# Patient Record
Sex: Male | Born: 1980 | Race: White | Hispanic: No | Marital: Married | Smoking: Former smoker
Health system: Southern US, Community
[De-identification: ages and names within clinical notes are randomized; demographics above are authoritative.]

## PROBLEM LIST (undated history)

## (undated) DIAGNOSIS — F1110 Opioid abuse, uncomplicated: Secondary | ICD-10-CM

## (undated) DIAGNOSIS — W3400XA Accidental discharge from unspecified firearms or gun, initial encounter: Secondary | ICD-10-CM

## (undated) DIAGNOSIS — W540XXA Bitten by dog, initial encounter: Secondary | ICD-10-CM

## (undated) DIAGNOSIS — G894 Chronic pain syndrome: Secondary | ICD-10-CM

## (undated) DIAGNOSIS — Y249XXA Unspecified firearm discharge, undetermined intent, initial encounter: Secondary | ICD-10-CM

## (undated) DIAGNOSIS — F1990 Other psychoactive substance use, unspecified, uncomplicated: Secondary | ICD-10-CM

## (undated) DIAGNOSIS — F32A Depression, unspecified: Secondary | ICD-10-CM

## (undated) DIAGNOSIS — F329 Major depressive disorder, single episode, unspecified: Secondary | ICD-10-CM

## (undated) DIAGNOSIS — F1410 Cocaine abuse, uncomplicated: Secondary | ICD-10-CM

## (undated) HISTORY — PX: OTHER SURGICAL HISTORY: SHX169

## (undated) HISTORY — PX: APPENDECTOMY: SHX54

---

## 2003-11-01 ENCOUNTER — Inpatient Hospital Stay (HOSPITAL_COMMUNITY): Admission: EM | Admit: 2003-11-01 | Discharge: 2003-11-02 | Payer: Self-pay

## 2004-03-01 ENCOUNTER — Emergency Department (HOSPITAL_COMMUNITY): Admission: EM | Admit: 2004-03-01 | Discharge: 2004-03-02 | Payer: Self-pay | Admitting: Emergency Medicine

## 2004-03-08 ENCOUNTER — Emergency Department (HOSPITAL_COMMUNITY): Admission: EM | Admit: 2004-03-08 | Discharge: 2004-03-08 | Payer: Self-pay | Admitting: Emergency Medicine

## 2011-04-27 ENCOUNTER — Emergency Department (HOSPITAL_COMMUNITY)
Admission: EM | Admit: 2011-04-27 | Discharge: 2011-04-28 | Disposition: A | Discharge status: Other Acute Inpatient Hospital | Payer: Self-pay | Attending: Emergency Medicine | Admitting: Emergency Medicine

## 2011-04-27 DIAGNOSIS — R45851 Suicidal ideations: Secondary | ICD-10-CM | POA: Insufficient documentation

## 2011-04-27 DIAGNOSIS — IMO0002 Reserved for concepts with insufficient information to code with codable children: Secondary | ICD-10-CM | POA: Insufficient documentation

## 2011-04-27 DIAGNOSIS — F1910 Other psychoactive substance abuse, uncomplicated: Secondary | ICD-10-CM | POA: Insufficient documentation

## 2011-04-27 LAB — COMPREHENSIVE METABOLIC PANEL
ALT: 16 U/L (ref 0–53)
AST: 22 U/L (ref 0–37)
Albumin: 4.3 g/dL (ref 3.5–5.2)
Alkaline Phosphatase: 55 U/L (ref 39–117)
BUN: 10 mg/dL (ref 6–23)
CO2: 26 mEq/L (ref 19–32)
Calcium: 9.8 mg/dL (ref 8.4–10.5)
Chloride: 106 mEq/L (ref 96–112)
Creatinine, Ser: 1.02 mg/dL (ref 0.4–1.5)
GFR calc Af Amer: >60 mL/min (ref >60)
GFR calc non Af Amer: >60 mL/min (ref >60)
Glucose, Bld: 107 mg/dL — ABNORMAL HIGH (ref 70–99)
Potassium: 3.9 mEq/L (ref 3.5–5.1)
Sodium: 142 mEq/L (ref 135–145)
Total Bilirubin: 0.2 mg/dL — ABNORMAL LOW (ref 0.3–1.2)
Total Protein: 7.3 g/dL (ref 6.0–8.3)

## 2011-04-27 LAB — CBC
HCT: 44.2 % (ref 39.0–52.0)
Hemoglobin: 15.1 g/dL (ref 13.0–17.0)
MCH: 32.4 pg (ref 26.0–34.0)
MCHC: 34.2 g/dL (ref 30.0–36.0)
MCV: 94.8 fL (ref 78.0–100.0)
Platelets: 327 10*3/uL (ref 150–400)
RBC: 4.66 MIL/uL (ref 4.22–5.81)
RDW: 13.9 % (ref 11.5–15.5)
WBC: 5.1 10*3/uL (ref 4.0–10.5)

## 2011-04-27 LAB — RAPID URINE DRUG SCREEN, HOSP PERFORMED
Amphetamines: NOT DETECTED
Barbiturates: NOT DETECTED
Benzodiazepines: NOT DETECTED
Cocaine: POSITIVE — AB
Opiates: NOT DETECTED
Tetrahydrocannabinol: POSITIVE — AB

## 2011-04-27 LAB — DIFFERENTIAL
Basophils Absolute: 0.1 10*3/uL (ref 0.0–0.1)
Basophils Relative: 2 % — ABNORMAL HIGH (ref 0–1)
Eosinophils Absolute: 0.1 10*3/uL (ref 0.0–0.7)
Eosinophils Relative: 2 % (ref 0–5)
Lymphocytes Relative: 29 % (ref 12–46)
Lymphs Abs: 1.5 10*3/uL (ref 0.7–4.0)
Monocytes Absolute: 0.5 10*3/uL (ref 0.1–1.0)
Monocytes Relative: 11 % (ref 3–12)
Neutro Abs: 2.9 10*3/uL (ref 1.7–7.7)
Neutrophils Relative %: 57 % (ref 43–77)

## 2011-04-27 LAB — ETHANOL: Alcohol, Ethyl (B): <11 mg/dL — ABNORMAL HIGH (ref 0–10)

## 2011-04-28 ENCOUNTER — Inpatient Hospital Stay (HOSPITAL_COMMUNITY)
Admission: AD | Admit: 2011-04-28 | Discharge: 2011-04-30 | DRG: 897 | Disposition: A | Discharge status: Home / Self Care | Payer: PRIVATE HEALTH INSURANCE | Source: Ambulatory Visit | Attending: Psychiatry | Admitting: Psychiatry

## 2011-04-28 DIAGNOSIS — R45851 Suicidal ideations: Secondary | ICD-10-CM

## 2011-04-28 DIAGNOSIS — F1020 Alcohol dependence, uncomplicated: Principal | ICD-10-CM

## 2011-04-28 DIAGNOSIS — F1420 Cocaine dependence, uncomplicated: Secondary | ICD-10-CM

## 2011-04-28 DIAGNOSIS — F4310 Post-traumatic stress disorder, unspecified: Secondary | ICD-10-CM

## 2011-04-29 DIAGNOSIS — F1920 Other psychoactive substance dependence, uncomplicated: Secondary | ICD-10-CM

## 2011-04-29 LAB — TSH: TSH: 1.779 u[IU]/mL (ref 0.350–4.500)

## 2011-05-06 NOTE — H&P (Signed)
  NAME:  Jimmy Rogers, Jimmy Rogers NO.:  [id number]  MEDICAL RECORD NO.:  [id number]  LOCATION:  0301                          FACILITY:  [facility]  PHYSICIAN:  Franchot Gallo, MD     DATE OF BIRTH:  [birth year]  DATE OF ADMISSION:  04/28/2011 DATE OF DISCHARGE:  04/30/2011                      PSYCHIATRIC ADMISSION ASSESSMENT   IDENTIFYING INFORMATION:  This is a 30 year old Caucasian male.  This is a voluntary admission.  HISTORY OF PRESENT ILLNESS:  This is first BAC admission for Jimmy Rogers, who presented by way of our emergency room requesting detox from alcohol and crack cocaine.  He had been drinking alcohol for a couple of days and describes himself as a ticking time bomb.  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 He is requesting help getting sober and establishing a relapse prevention program.  MEDICAL EVALUATION AND DIAGNOSTIC STUDIES:  Medically evaluated in the [facility] emergency room.  He presented as an afebrile normally- developed well-nourished male with normal vital signs.  No acute signs of withdrawal.  No history of blackouts, seizures or DTs and no chronic medical illnesses.  CBC was normal.  Alcohol screen negative.  Urine drug screen positive for marijuana and cocaine and basic chemistry and electrolytes normal.  COURSE OF HOSPITALIZATION:  He is admitted to our Detox Unit and gradually assimilated into the milieu participation with group therapy and peers and staff was appropriate while here.  He was able to verbalize that he had broken up with his girlfriend because of his drug use and he was hoping to get her back and live a sober life.  He felt he had gotten involved with the wrong social group and was wanting to change his contacts and his lifestyle.  He did not display any acute withdrawal symptoms while here.  We made Librium 25 mg q.6 h p.r.n. for alcohol withdrawal available while he was  here and he did not require a full detox protocol.  Consistently and convincingly denied any suicidal thoughts and was ready for discharge by April 30, 2011.  DISCHARGE DIAGNOSES:  AXIS I:  Polysubstance dependence. AXIS II:  No diagnosis. AXIS III:  No diagnosis. AXIS IV:  Chronic social stressors. AXIS V:  Current 60, past year not known.  DISCHARGE CONDITION:  Stable.  DISCHARGE MEDICATIONS:  Hydroxyzine 25 mg b.i.d. for anxiety and 50 mg as needed at night for insomnia.  FOLLOWUP:  Plan is to complete 90 meetings in 90 days at [facility] and he is scheduled for follow-up counseling at his request at [facility] mental health services on May 03, 2011 at 9:00 a.m.     Margaret A. Lorin Picket, N.P.   ______________________________ Franchot Gallo, MD    MAS/MEDQ  D:  05/02/2011  T:  05/02/2011  Job:  [id number]  Electronically Signed by Kari Baars N.P. on 05/06/2011 05:02:17 PM Electronically Signed by Franchot Gallo MD on 05/06/2011 06:12:11 PM

## 2011-05-17 NOTE — H&P (Signed)
NAME:  Jimmy Rogers, Jimmy Rogers NO.:  [id number]  MEDICAL RECORD NO.:  [id number]           PATIENT TYPE:  I  LOCATION:  0306                          FACILITY:  [facility]  PHYSICIAN:  Eulogio Ditch, MD DATE OF BIRTH:  [birth year]  DATE OF ADMISSION:  04/28/2011 DATE OF DISCHARGE:                      PSYCHIATRIC ADMISSION ASSESSMENT   HPI:  This is a voluntary admission of a 30 year old single white male. He presented at [facility] requesting detox from alcohol, cocaine, and crack.  He reported that his last drink and drug use were earlier in the morning.  He reports nausea and back pain due to not eating right.  He states:  "I've been on the edge lately, and if I don't get my health right, I don't know what I can do."  He reports telling his mother that he wants to kill himself.  He reports being on Xanax in the past, and that has helped him, but recently not being able to get it filled due to insurance issues.  Interestingly enough, his urinalysis was positive for cocaine and marijuana.  He had no measurable alcohol. During the interview, he amended this story to he knew he needed to come in to get off of his stress and get away from the cocaine.  He says he has only been drinking for a couple of days.  He has never had any history of DTs, blackouts, or seizures; and in terms of the Xanax, he had a therapist at one point who recommended Xanax, but as he was uninsured, he could never go get a prescription.  His mother apparently has given him some of her benzos on occasion.  The mother has been trying to help him find inpatient detox resources.  Per the mother, he stated he was going to kill himself if he did not get the help he needed.  The patient states he is a ticking time bomb.  PAST PSYCHIATRIC HISTORY:  He says at age 46, he saw a psychologist for a few visits, so that was 3 years ago.  SOCIAL HISTORY:  He is a high Garment/textile technologist in 2003.  He has  never married.  He has no children.  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  FAMILY HISTORY:  He denies an alcohol and drug history.  He states he drinks here and there.  He needed to be admitted for a break from cocaine, according to the ACT team.  He states that he has been using about $1,000 worth daily for the past 2 to 3 months, and his first use was at age 30, almost 10 years ago.  PRIMARY CARE PROVIDER:  He does not have any.  MEDICAL PROBLEMS:  He states his blood pressure elevates when stressed.  MEDICATIONS:  None.  DRUG ALLERGIES:  NO KNOWN DRUG ALLERGIES.  POSITIVE PHYSICAL FINDINGS:  He was medically cleared in the ED at [facility].  His vital signs showed he was afebrile.  His temperature ranged from 97.4 up to 98.5.  His pulse was 64 to 93.  Respirations were 16 to  20, and his blood pressure was 112/74 to a marked elevation of 128/82.  He appears to be his stated age.  He is in no acute distress, and he has no history of blackouts, seizures, or DTs.  His CBC had no abnormalities.  His metabolic profile showed a slightly elevated glucose at 107.  He had no measurable alcohol, just marijuana and cocaine in his urine.  MENTAL STATUS EXAM:  Tonight, he is alert and oriented.  He is appropriately groomed, dressed, and nourished.  He is in no acute distress.  His speech is not pressured.  His mood is appropriate to the situation.  Affect has a normal range.  Thought processes are clear, rational, goal-oriented.  He is wanting to know when he will next get medicine, as the Ativan 1 mg that he received over in the Emergency Room was just not enough to deal with his stress.  He denies being suicidal or homicidal.  He smiles and says:  "I had to say that to get in.  I needed to get away for a few days."  He has no auditory or visual hallucinations. AXIS I:  Polysubstance dependence. AXIS II:  Deferred. AXIS III:  None  known. AXIS IV:  Moderate (living environment). AXIS V:  55.  The plan is to admit for safety and stabilization.  We will give him Librium 25 mg p.o. q.6h. p.r.n. alcohol withdrawal.  He can have Vistaril 50 mg at h.s.; may repeat x1.  He can discuss tomorrow further medication.  The estimated length of stay is 2 to 3 days.     Mickie Leonarda Salon, P.A.-C.   ______________________________ Eulogio Ditch, MD    MD/MEDQ  D:  04/28/2011  T:  04/28/2011  Job:  [id number]  Electronically Signed by Jaci Lazier ADAMS P.A.-C. on 05/08/2011 09:46:38 AM Electronically Signed by Eulogio Ditch  on 05/17/2011 09:54:48 AM

## 2011-06-27 ENCOUNTER — Emergency Department (HOSPITAL_COMMUNITY)
Admission: EM | Admit: 2011-06-27 | Discharge: 2011-06-27 | Disposition: A | Discharge status: Home / Self Care | Payer: PRIVATE HEALTH INSURANCE | Attending: Emergency Medicine | Admitting: Emergency Medicine

## 2011-06-27 ENCOUNTER — Emergency Department (HOSPITAL_COMMUNITY): Payer: PRIVATE HEALTH INSURANCE

## 2011-06-27 DIAGNOSIS — R1012 Left upper quadrant pain: Secondary | ICD-10-CM | POA: Insufficient documentation

## 2011-06-27 DIAGNOSIS — M549 Dorsalgia, unspecified: Secondary | ICD-10-CM | POA: Insufficient documentation

## 2011-06-27 DIAGNOSIS — Y990 Civilian activity done for income or pay: Secondary | ICD-10-CM | POA: Insufficient documentation

## 2011-06-27 DIAGNOSIS — R079 Chest pain, unspecified: Secondary | ICD-10-CM | POA: Insufficient documentation

## 2011-06-27 DIAGNOSIS — S301XXA Contusion of abdominal wall, initial encounter: Secondary | ICD-10-CM | POA: Insufficient documentation

## 2011-06-27 DIAGNOSIS — IMO0002 Reserved for concepts with insufficient information to code with codable children: Secondary | ICD-10-CM | POA: Insufficient documentation

## 2011-06-27 DIAGNOSIS — Y9269 Other specified industrial and construction area as the place of occurrence of the external cause: Secondary | ICD-10-CM | POA: Insufficient documentation

## 2011-06-27 MED ORDER — IOHEXOL 300 MG/ML  SOLN
100.0000 mL | Freq: Once | INTRAMUSCULAR | Status: AC | PRN
  Administered 2011-06-27: 100 mL via INTRAVENOUS

## 2011-07-25 ENCOUNTER — Emergency Department (HOSPITAL_COMMUNITY)
Admission: EM | Admit: 2011-07-25 | Discharge: 2011-07-25 | Disposition: A | Discharge status: Home / Self Care | Payer: PRIVATE HEALTH INSURANCE | Attending: Emergency Medicine | Admitting: Emergency Medicine

## 2011-07-25 DIAGNOSIS — Z042 Encounter for examination and observation following work accident: Secondary | ICD-10-CM | POA: Insufficient documentation

## 2012-01-10 ENCOUNTER — Emergency Department (HOSPITAL_BASED_OUTPATIENT_CLINIC_OR_DEPARTMENT_OTHER)
Admission: EM | Admit: 2012-01-10 | Discharge: 2012-01-10 | Disposition: A | Discharge status: Home / Self Care | Payer: Self-pay | Attending: Emergency Medicine | Admitting: Emergency Medicine

## 2012-01-10 ENCOUNTER — Encounter (HOSPITAL_BASED_OUTPATIENT_CLINIC_OR_DEPARTMENT_OTHER): Payer: Self-pay | Admitting: *Deleted

## 2012-01-10 DIAGNOSIS — W540XXA Bitten by dog, initial encounter: Secondary | ICD-10-CM | POA: Insufficient documentation

## 2012-01-10 DIAGNOSIS — Y92410 Unspecified street and highway as the place of occurrence of the external cause: Secondary | ICD-10-CM | POA: Insufficient documentation

## 2012-01-10 DIAGNOSIS — S01512A Laceration without foreign body of oral cavity, initial encounter: Secondary | ICD-10-CM

## 2012-01-10 DIAGNOSIS — S01501A Unspecified open wound of lip, initial encounter: Secondary | ICD-10-CM | POA: Insufficient documentation

## 2012-01-10 MED ORDER — OXYCODONE-ACETAMINOPHEN 5-325 MG PO TABS: 2.0000 | ORAL_TABLET | ORAL | Status: AC | PRN

## 2012-01-10 MED ORDER — AMOXICILLIN-POT CLAVULANATE 875-125 MG PO TABS: 1.0000 | ORAL_TABLET | Freq: Two times a day (BID) | ORAL | Status: AC

## 2012-01-10 MED ORDER — AMOXICILLIN-POT CLAVULANATE 875-125 MG PO TABS
1.0000 | ORAL_TABLET | Freq: Once | ORAL | Status: AC
  Administered 2012-01-10: 1 via ORAL
  Filled 2012-01-10: qty 1

## 2012-01-10 MED ORDER — TETANUS-DIPHTH-ACELL PERTUSSIS 5-2.5-18.5 LF-MCG/0.5 IM SUSP
0.5000 mL | Freq: Once | INTRAMUSCULAR | Status: AC
  Administered 2012-01-10: 0.5 mL via INTRAMUSCULAR
  Filled 2012-01-10: qty 0.5

## 2012-01-10 MED ORDER — LIDOCAINE-EPINEPHRINE-TETRACAINE (LET) SOLUTION
3.0000 mL | Freq: Once | NASAL | Status: AC
  Administered 2012-01-10: 3 mL via TOPICAL

## 2012-01-10 MED ORDER — LIDOCAINE-EPINEPHRINE-TETRACAINE (LET) SOLUTION
NASAL | Status: AC
  Administered 2012-01-10: 3 mL via TOPICAL
  Filled 2012-01-10: qty 3

## 2012-01-10 MED ORDER — LIDOCAINE HCL 2 % IJ SOLN
10.0000 mL | Freq: Once | INTRAMUSCULAR | Status: AC
  Administered 2012-01-10: 20 mg via INTRADERMAL
  Filled 2012-01-10: qty 1

## 2012-01-10 MED ORDER — OXYCODONE-ACETAMINOPHEN 5-325 MG PO TABS
2.0000 | ORAL_TABLET | Freq: Once | ORAL | Status: AC
  Administered 2012-01-10: 2 via ORAL
  Filled 2012-01-10: qty 2

## 2012-01-10 NOTE — Discharge Instructions (Signed)
Animal Bite An animal bite can result in a scratch on the skin, deep open cut, puncture of the skin, crush injury, or tearing away of the skin or a body part. Dogs are responsible for most animal bites. Children are bitten more often than adults. An animal bite can range from very mild to more serious. A small bite from your house pet is no cause for alarm. However, some animal bites can become infected or injure a bone or other tissue. You must seek medical care if:  The skin is broken and bleeding does not slow down or stop after 15 minutes.   The puncture is deep and difficult to clean (such as a cat bite).   Pain, warmth, redness, or pus develops around the wound.   The bite is from a stray animal or rodent. There may be a risk of rabies infection.   The bite is from a snake, raccoon, skunk, fox, coyote, or bat. There may be a risk of rabies infection.   The person bitten has a chronic illness such as diabetes, liver disease, or cancer, or the person takes medicine that lowers the immune system.   There is concern about the location and severity of the bite.  It is important to clean and protect an animal bite wound right away to prevent infection. Follow these steps:  Clean the wound with plenty of water and soap.   Apply an antibiotic cream.   Apply gentle pressure over the wound with a clean towel or gauze to slow or stop bleeding.   Elevate the affected area above the heart to help stop any bleeding.   Seek medical care. Getting medical care within 8 hours of the animal bite leads to the best possible outcome.  DIAGNOSIS  Your caregiver will most likely:  Take a detailed history of the animal and the bite injury.   Perform a wound exam.   Take your medical history.  Blood tests or X-rays may be performed. Sometimes, infected bite wounds are cultured and sent to a lab to identify the infectious bacteria.  TREATMENT  Medical treatment will depend on the location and type of  animal bite as well as the patient's medical history. Treatment may include:  Wound care, such as cleaning and flushing the wound with saline solution, bandaging, and elevating the affected area.   Antibiotics.   Tetanus immunization.   Rabies immunization.   Leaving the wound open to heal. This is often done with animal bites, due to the high risk of infection. However, in certain cases, wound closure with stitches, wound adhesive, skin adhesive strips, or staples may be used.  Infected bites that are left untreated may require intravenous (IV) antibiotics and surgical treatment in the hospital. HOME CARE INSTRUCTIONS  Follow your caregiver's instructions for wound care.   Take all medicines as directed.   If your caregiver prescribes antibiotics, take them as directed. Finish them even if you start to feel better.   Follow up with your caregiver for further exams or immunizations as directed.  You may need a tetanus shot if:  You cannot remember when you had your last tetanus shot.   You have never had a tetanus shot.   The injury broke your skin.  If you get a tetanus shot, your arm may swell, get red, and feel warm to the touch. This is common and not a problem. If you need a tetanus shot and you choose not to have one, there is a   rare chance of getting tetanus. Sickness from tetanus can be serious. SEEK MEDICAL CARE IF:  You notice warmth, redness, soreness, swelling, pus discharge, or a bad smell coming from the wound.   You have a red line on the skin coming from the wound.   You have a fever, chills, or a general ill feeling.   You have nausea or vomiting.   You have continued or worsening pain.   You have trouble moving the injured part.   You have other questions or concerns.  MAKE SURE YOU:  Understand these instructions.   Will watch your condition.   Will get help right away if you are not doing well or get worse.  Document Released: 08/02/2011  Document Reviewed: 07/06/2011 [employer] Patient Information 2012 [employer]. 

## 2012-01-10 NOTE — ED Notes (Signed)
Walking his dog  3 other dogs jumped on his dog he picked his dog up and his dog bit him in the mouth happened one hour ago  States his dog is vaccinated.Bleeding from lip right upper lip large laceration, puncture and laceration on upper left lip also.

## 2012-01-10 NOTE — ED Notes (Signed)
PA Sophia at bedside suturing pt.

## 2012-01-10 NOTE — ED Provider Notes (Signed)
Medical screening examination/treatment/procedure(s) were conducted as a shared visit with non-physician practitioner(s) and myself.  I personally evaluated the patient during the encounter  Pt seen and evaluated by me as well- multiple complicated lacerations to lip, closed by PA.  Much improved appearance after closure with swelling of right upper lip  Jimmy Chick, MD 01/10/12 [phone]

## 2012-01-10 NOTE — ED Provider Notes (Signed)
History     CSN: [id number]  Arrival date & time 01/10/12  1658   First MD Initiated Contact with Patient 01/10/12 1704      Chief Complaint  Patient presents with  . Animal Bite    (Consider location/radiation/quality/duration/timing/severity/associated sxs/prior treatment) Patient is a 31 y.o. male presenting with animal bite. The history is provided by the patient. No language interpreter was used.  Animal Bite  The incident occurred just prior to arrival. There is an injury to the head and mouth. The pain is moderate. It is unlikely that a foreign body is present. Pertinent negatives include no tingling. There have been no prior injuries to these areas. His tetanus status is out of date. He has been behaving normally. There were no sick contacts. He has received no recent medical care.   patient reports he was walking his dog when 3 other dogs ran at him and tried to attack his dog. Patient reports he picked up his dog to protect him and his dog bit him in the upper lip. Patient reports his dog shots are up-to-date. Patient reports he was not bitten by the other dogs. Patient denies any other area of injury other than upper lip. Patient denies any medical problems. patient thinks upper teeth may be loose.  History reviewed. No pertinent past medical history.  History reviewed. No pertinent past surgical history.  History reviewed. No pertinent family history.  History  Substance Use Topics  . Smoking status: Current Everyday Smoker  . Smokeless tobacco: Not on file  . Alcohol Use: Yes      Review of Systems  Skin: Positive for wound.  Neurological: Negative for tingling.  All other systems reviewed and are negative.    Allergies  Vicodin  Home Medications   Current Outpatient Rx  Name Route Sig Dispense Refill  . AMOXICILLIN-POT CLAVULANATE 875-125 MG PO TABS Oral Take 1 tablet by mouth 2 (two) times daily. 10 tablet 0  . OXYCODONE-ACETAMINOPHEN 5-325 MG PO TABS  Oral Take 2 tablets by mouth every 4 (four) hours as needed for pain. 16 tablet 0    BP 147/84  Pulse 76  Temp(Src) 98.5 F (36.9 C) (Oral)  Resp 20  Wt 165 lb (74.844 kg)  SpO2 100%  Physical Exam  Nursing note and vitals reviewed. Constitutional: He is oriented to person, place, and time. He appears well-developed and well-nourished.  HENT:  Head: Normocephalic and atraumatic.  Right Ear: External ear normal.  Left Ear: External ear normal.  Nose: Nose normal.  Mouth/Throat: Oropharynx is clear and moist.       Laceration mid upper lip, multiple tear areas through mid vermilion border. Approximately 1.5 cm mid lip. 2 cm gaping laceration right upper outer lip  Eyes: Conjunctivae and EOM are normal. Pupils are equal, round, and reactive to light.  Neck: Normal range of motion.  Cardiovascular: Normal rate.   Pulmonary/Chest: Effort normal.  Musculoskeletal: Normal range of motion.  Neurological: He is alert and oriented to person, place, and time. He has normal reflexes. No cranial nerve deficit.  Skin: Skin is warm.  Psychiatric: He has a normal mood and affect.    ED Course  LACERATION REPAIR Date/Time: 01/10/2012 6:35 PM Performed by: Langston Masker Authorized by: Langston Masker Consent: Verbal consent obtained. Written consent not obtained. Risks and benefits: risks, benefits and alternatives were discussed Consent given by: patient Patient understanding: patient states understanding of the procedure being performed Patient identity confirmed: verbally with patient and arm  band Time out: Immediately prior to procedure a "time out" was called to verify the correct patient, procedure, equipment, support staff and site/side marked as required. Body area: mouth Laceration length: 1.5 cm Foreign bodies: no foreign bodies Tendon involvement: none Anesthesia: local infiltration Patient sedated: no Preparation: Patient was prepped and draped in the usual sterile  fashion. Irrigation solution: saline Amount of cleaning: extensive Debridement: minimal Degree of undermining: none Skin closure: 6-0 Prolene Subcutaneous closure: 5-0 Chromic gut Number of sutures: 7 Technique: simple Approximation: loose Approximation difficulty: complex Patient tolerance: Patient tolerated the procedure well with no immediate complications.  LACERATION REPAIR Date/Time: 01/10/2012 6:39 PM Performed by: Langston Masker Authorized by: Langston Masker Consent: Verbal consent obtained. Risks and benefits: risks, benefits and alternatives were discussed Consent given by: patient Patient understanding: patient states understanding of the procedure being performed Patient identity confirmed: verbally with patient and arm band Time out: Immediately prior to procedure a "time out" was called to verify the correct patient, procedure, equipment, support staff and site/side marked as required. Body area: mouth Laceration length: 2 cm Contamination: The wound is contaminated. Foreign bodies: no foreign bodies Vascular damage: no Anesthesia: local infiltration Local anesthetic: lidocaine 2% without epinephrine Patient sedated: no Preparation: Patient was prepped and draped in the usual sterile fashion. Irrigation solution: saline Amount of cleaning: extensive Debridement: minimal Skin closure: 6-0 Prolene Subcutaneous closure: 5-0 Vicryl Number of sutures: 10 Technique: complex Approximation: close Approximation difficulty: complex Patient tolerance: Patient tolerated the procedure well with no immediate complications. Comments: Complicated closure with 3 sutures to the deep tissue to approximate area. Debridement of disrupted dark tissue. Wounds approximated well.   (including critical care time)  Labs Reviewed - No data to display No results found.   No diagnosis found.    MDM   patient given 2 Percocets, Augmentin 875 his tetanus is updated, let is applied to  lacerations. Patient counseled that this wound is high risk for infection. I counseled patient on dog bites patient is to return here in 2 days to recheck with me. He is to return here on Sunday at 4 PM to see me for suture removal. Patient advised he may need to see a plastic surgeon for cosmetic revision at a later date. Patient agrees to return to the emergency department if any signs of infection or any problem before suggested recheck.        Langston Masker, [place] 01/10/12 2243

## 2012-01-12 ENCOUNTER — Encounter (HOSPITAL_BASED_OUTPATIENT_CLINIC_OR_DEPARTMENT_OTHER): Payer: Self-pay | Admitting: *Deleted

## 2012-01-12 ENCOUNTER — Emergency Department (HOSPITAL_BASED_OUTPATIENT_CLINIC_OR_DEPARTMENT_OTHER)
Admission: EM | Admit: 2012-01-12 | Discharge: 2012-01-12 | Disposition: A | Discharge status: Home / Self Care | Payer: Self-pay | Attending: Emergency Medicine | Admitting: Emergency Medicine

## 2012-01-12 DIAGNOSIS — S0181XA Laceration without foreign body of other part of head, initial encounter: Secondary | ICD-10-CM

## 2012-01-12 DIAGNOSIS — Z09 Encounter for follow-up examination after completed treatment for conditions other than malignant neoplasm: Secondary | ICD-10-CM | POA: Insufficient documentation

## 2012-01-12 MED ORDER — OXYCODONE-ACETAMINOPHEN 5-325 MG PO TABS: 2.0000 | ORAL_TABLET | ORAL | Status: DC | PRN

## 2012-01-12 NOTE — ED Provider Notes (Signed)
History     CSN: [id number]  Arrival date & time 01/12/12  1316   First MD Initiated Contact with Patient 01/12/12 1323      Chief Complaint  Patient presents with  . Wound Check    dog bite    (Consider location/radiation/quality/duration/timing/severity/associated sxs/prior treatment) Patient is a 31 y.o. male presenting with wound check. The history is provided by the patient. No language interpreter was used.  Wound Check  He was treated in the ED 2 to 3 days ago. Previous treatment in the ED includes laceration repair. Treatments since wound repair include oral antibiotics. There has been bloody discharge from the wound. The swelling has improved. The pain has improved.  Pt reports area is feeling better  History reviewed. No pertinent past medical history.  History reviewed. No pertinent past surgical history.  No family history on file.  History  Substance Use Topics  . Smoking status: Current Everyday Smoker  . Smokeless tobacco: Not on file  . Alcohol Use: Yes      Review of Systems  Skin: Positive for wound.  All other systems reviewed and are negative.    Allergies  Vicodin  Home Medications   Current Outpatient Rx  Name Route Sig Dispense Refill  . AMOXICILLIN-POT CLAVULANATE 875-125 MG PO TABS Oral Take 1 tablet by mouth 2 (two) times daily. 10 tablet 0  . OXYCODONE-ACETAMINOPHEN 5-325 MG PO TABS Oral Take 2 tablets by mouth every 4 (four) hours as needed for pain. 16 tablet 0  . OXYCODONE-ACETAMINOPHEN 5-325 MG PO TABS Oral Take 2 tablets by mouth every 4 (four) hours as needed for pain. 20 tablet 0    BP 107/73  Pulse 81  Temp(Src) 97.8 F (36.6 C) (Oral)  Resp 18  Ht 6' (1.829 m)  Wt 165 lb (74.844 kg)  BMI 22.38 kg/m2  SpO2 95%  Physical Exam  Nursing note and vitals reviewed. Constitutional: He is oriented to person, place, and time. He appears well-developed and well-nourished.  HENT:  Head: Normocephalic.       Healing  lacerations mouth and lips,  No sign of infection  Musculoskeletal: Normal range of motion.  Neurological: He is alert and oriented to person, place, and time.  Skin: Skin is warm.  Psychiatric: He has a normal mood and affect.    ED Course  Procedures (including critical care time)  Labs Reviewed - No data to display No results found.   1. Laceration of face       MDM  Pt rechecked due to high risk of infection.  Pt is taking antibiotics       Langston Masker, [place] 01/12/12 1412

## 2012-01-12 NOTE — ED Notes (Signed)
Pt. Has been seen by [facility] and will be seen again on Sunday.  Pt. Has no s/s of oozing or bleeding at present time.

## 2012-01-12 NOTE — ED Provider Notes (Signed)
Medical screening examination/treatment/procedure(s) were performed by non-physician practitioner and as supervising physician I was immediately available for consultation/collaboration.  Raeford Razor, MD 01/12/12 [phone]

## 2012-01-12 NOTE — Discharge Instructions (Signed)
Facial Laceration °A facial laceration is a cut on the face. Lacerations usually heal quickly, but they need special care to reduce scarring. It will take 1 to 2 years for the scar to lose its redness and to heal completely. °TREATMENT  °Some facial lacerations may not require closure. Some lacerations may not be able to be closed due to an increased risk of infection. It is important to see your caregiver as soon as possible after an injury to minimize the risk of infection and to maximize the opportunity for successful closure. °If closure is appropriate, pain medicines may be given, if needed. The wound will be cleaned to help prevent infection. Your caregiver will use stitches (sutures), staples, wound glue (adhesive), or skin adhesive strips to repair the laceration. These tools bring the skin edges together to allow for faster healing and a better cosmetic outcome. However, all wounds will heal with a scar.  °Once the wound has healed, scarring can be minimized by covering the wound with sunscreen during the day for 1 full year. Use a sunscreen with an SPF of at least 30. Sunscreen helps to reduce the pigment that will form in the scar. When applying sunscreen to a completely healed wound, massage the scar for a few minutes to help reduce the appearance of the scar. Use circular motions with your fingertips, on and around the scar. Do not massage a healing wound. °HOME CARE INSTRUCTIONS °For sutures: °· Keep the wound clean and dry.  °· If you were given a bandage (dressing), you should change it at least once a day. Also change the dressing if it becomes wet or dirty, or as directed by your caregiver.  °· Wash the wound with soap and water 2 times a day. Rinse the wound off with water to remove all soap. Pat the wound dry with a clean towel.  °· After cleaning, apply a thin layer of the antibiotic ointment recommended by your caregiver. This will help prevent infection and keep the dressing from sticking.   °· You may shower as usual after the first 24 hours. Do not soak the wound in water until the sutures are removed.  °· Only take over-the-counter or prescription medicines for pain, discomfort, or fever as directed by your caregiver.  °· Get your sutures removed as directed by your caregiver. With facial lacerations, sutures should usually be taken out after 4 to 5 days to avoid stitch marks.  °· Wait a few days after your sutures are removed before applying makeup.  °For skin adhesive strips: °· Keep the wound clean and dry.  °· Do not get the skin adhesive strips wet. You may bathe carefully, using caution to keep the wound dry.  °· If the wound gets wet, pat it dry with a clean towel.  °· Skin adhesive strips will fall off on their own. You may trim the strips as the wound heals. Do not remove skin adhesive strips that are still stuck to the wound. They will fall off in time.  °For wound adhesive: °· You may briefly wet your wound in the shower or bath. Do not soak or scrub the wound. Do not swim. Avoid periods of heavy perspiration until the skin adhesive has fallen off on its own. After showering or bathing, gently pat the wound dry with a clean towel.  °· Do not apply liquid medicine, cream medicine, ointment medicine, or makeup to your wound while the skin adhesive is in place. This may loosen the film   before your wound is healed.  °· If a dressing is placed over the wound, be careful not to apply tape directly over the skin adhesive. This may cause the adhesive to be pulled off before the wound is healed.  °· Avoid prolonged exposure to sunlight or tanning lamps while the skin adhesive is in place. Exposure to ultraviolet light in the first year will darken the scar.  °· The skin adhesive will usually remain in place for 5 to 10 days, then naturally fall off the skin. Do not pick at the adhesive film.  °You may need a tetanus shot if: °· You cannot remember when you had your last tetanus shot.  °· You have  never had a tetanus shot.  °If you get a tetanus shot, your arm may swell, get red, and feel warm to the touch. This is common and not a problem. If you need a tetanus shot and you choose not to have one, there is a rare chance of getting tetanus. Sickness from tetanus can be serious. °SEEK IMMEDIATE MEDICAL CARE IF: °· You develop redness, pain, or swelling around the wound.  °· There is yellowish-white fluid (pus) coming from the wound.  °· You develop chills or a fever.  °MAKE SURE YOU: °· Understand these instructions.  °· Will watch your condition.  °· Will get help right away if you are not doing well or get worse.  °Document Released: 12/22/2004 Document Revised: 07/27/2011 Document Reviewed: 05/09/2011 °[employer]® Patient Information ©2012 [employer]. °

## 2012-01-15 ENCOUNTER — Encounter (HOSPITAL_BASED_OUTPATIENT_CLINIC_OR_DEPARTMENT_OTHER): Payer: Self-pay | Admitting: *Deleted

## 2012-01-15 ENCOUNTER — Emergency Department (HOSPITAL_BASED_OUTPATIENT_CLINIC_OR_DEPARTMENT_OTHER)
Admission: EM | Admit: 2012-01-15 | Discharge: 2012-01-15 | Disposition: A | Discharge status: Home / Self Care | Payer: Self-pay | Attending: Emergency Medicine | Admitting: Emergency Medicine

## 2012-01-15 DIAGNOSIS — Z4802 Encounter for removal of sutures: Secondary | ICD-10-CM | POA: Insufficient documentation

## 2012-01-15 DIAGNOSIS — IMO0002 Reserved for concepts with insufficient information to code with codable children: Secondary | ICD-10-CM

## 2012-01-15 MED ORDER — OXYCODONE-ACETAMINOPHEN 5-325 MG PO TABS: 2.0000 | ORAL_TABLET | ORAL | Status: AC | PRN

## 2012-01-15 MED ORDER — OXYCODONE-ACETAMINOPHEN 5-325 MG PO TABS: 2.0000 | ORAL_TABLET | Freq: Once | ORAL | Status: DC

## 2012-01-15 NOTE — ED Notes (Signed)
Pt was discharged by Asencion Islam, RN with out receiving Percocet.

## 2012-01-15 NOTE — ED Notes (Signed)
I cleaned the patient's lip and had to pick off the scabs so the PA Langston Masker could get to the sutures. The patient was very nice and tolerated it without complications. I showed him how to keep the wound clean so that he won't get any more scabs on his lip. And he would heal better.

## 2012-01-15 NOTE — ED Notes (Signed)
Pt here for suture removal (lips). Burning and sensitive per patient.

## 2012-01-15 NOTE — ED Provider Notes (Signed)
History     CSN: [id number]  Arrival date & time 01/15/12  1646   First MD Initiated Contact with Patient 01/15/12 1655      Chief Complaint  Patient presents with  . Suture / Staple Removal    (Consider location/radiation/quality/duration/timing/severity/associated sxs/prior treatment) Patient is a 31 y.o. male presenting with suture removal. The history is provided by the patient. No language interpreter was used.  Suture / Staple Removal  The sutures were placed 7 to 10 days ago. There has been no treatment since the wound repair. His temperature was unmeasured prior to arrival. There has been no drainage from the wound. The redness has improved. The swelling has improved. He has no difficulty moving the affected extremity or digit.  Pt here for suture removal  History reviewed. No pertinent past medical history.  History reviewed. No pertinent past surgical history.  History reviewed. No pertinent family history.  History  Substance Use Topics  . Smoking status: Current Everyday Smoker  . Smokeless tobacco: Not on file  . Alcohol Use: Yes      Review of Systems  Skin: Positive for wound.  All other systems reviewed and are negative.    Allergies  Vicodin  Home Medications   Current Outpatient Rx  Name Route Sig Dispense Refill  . AMOXICILLIN-POT CLAVULANATE 875-125 MG PO TABS Oral Take 1 tablet by mouth 2 (two) times daily. 10 tablet 0  . OXYCODONE-ACETAMINOPHEN 5-325 MG PO TABS Oral Take 2 tablets by mouth every 4 (four) hours as needed for pain. 16 tablet 0    BP 125/84  Pulse 88  Temp(Src) 98.4 F (36.9 C) (Oral)  Resp 18  Ht 6' (1.829 m)  Wt 170 lb (77.111 kg)  BMI 23.06 kg/m2  SpO2 100%  Physical Exam  Nursing note and vitals reviewed. Constitutional: He is oriented to person, place, and time. He appears well-developed and well-nourished.  HENT:  Head: Normocephalic and atraumatic.  Right Ear: External ear normal.  Left Ear: External ear  normal.  Nose: Nose normal.  Mouth/Throat: Oropharynx is clear and moist.  Eyes: Conjunctivae are normal. Pupils are equal, round, and reactive to light.  Musculoskeletal: Normal range of motion.  Neurological: He is alert and oriented to person, place, and time.  Skin: Skin is warm.       Well healed laceration face,  Lip healing,     Psychiatric: He has a normal mood and affect.    ED Course  Procedures (including critical care time)  Labs Reviewed - No data to display No results found.   No diagnosis found.    MDM     Prolene sutures removed,      Langston Masker, [place] 01/15/12 1758

## 2012-01-15 NOTE — Discharge Instructions (Signed)
Suture Removal Your caregiver has removed your sutures today. If skin adhesive strips were applied at the time of suturing, or applied following removal of the sutures today, they will begin to peel off in a couple more days. If skin adhesive strips remain after 14 days, they may be removed. HOME CARE INSTRUCTIONS   Change any bandages (dressings) it at least once a day or as directed by your caregiver. If the bandage sticks, soak it off with warm, soapy water.   Wash the area with soap and water to remove all the cream or ointment (if you were instructed to use any) 2 times a day. Rinse off the soap and pat the area dry with a clean towel.   Reapply cream or ointment as directed by your caregiver. This will help prevent infection and keep the bandage from sticking.   Keep the wound area dry and clean. If the bandage becomes wet, dirty, or develops a bad smell, change it as soon as possible.   Only take over-the-counter or prescription medicines for pain, discomfort, or fever as directed by your caregiver.   Use sunscreen when out in the sun. New scars become sunburned easily.   Return to your caregivers office in in 7 days or as directed to have your sutures removed.  You may need a tetanus shot if:  You cannot remember when you had your last tetanus shot.   You have never had a tetanus shot.   The injury broke your skin.  If you got a tetanus shot, your arm may swell, get red, and feel warm to the touch. This is common and not a problem. If you need a tetanus shot and you choose not to have one, there is a rare chance of getting tetanus. Sickness from tetanus can be serious. SEEK IMMEDIATE MEDICAL CARE IF:   There is redness, swelling, or increasing pain in the wound.   Pus is coming from the wound.   An unexplained oral temperature above 102 F (38.9 C) develops.   You notice a bad smell coming from the wound or dressing.   The wound breaks open (edges not staying together)  after sutures have been removed.  Document Released: 08/09/2001 Document Revised: 07/27/2011 Document Reviewed: 10/08/2007 [facility] Patient Information 2012 [place], [place].Scar Minimization You will have a scar anytime you have surgery and a cut is made in the skin or you have something removed from your skin (mole, skin cancer, cyst). Although scars are unavoidable following surgery, there are ways to minimize their appearance. It is important to follow all the instructions you receive from your caregiver about wound care. How your wound heals will influence the appearance of your scar. If you do not follow the wound care instructions as directed, complications such as infection may occur. Wound instructions include keeping the wound clean, moist, and not letting the wound form a scab. Some people form scars that are raised and lumpy (hypertrophic) or larger than the initial wound (keloidal). HOME CARE INSTRUCTIONS   Follow wound care instructions as directed.   Keep the wound clean by washing it with soap and water.   Keep the wound moist with provided antibiotic cream or petroleum jelly until completely healed. Moisten twice a day for about 2 weeks.   Get stitches (sutures) taken out at the scheduled time.   Avoid touching or manipulating your wound unless needed. Wash your hands thoroughly before and after touching your wound.   Follow all restrictions such as limits on  exercise or work. This depends on where your scar is located.   Keep the scar protected from sunburn. Cover the scar with sunscreen/sunblock with SPF 30 or higher.   Gently massage the scar using a circular motion to help minimize the appearance of the scar. Do this only after the wound has closed and all the sutures have been removed.   For hypertrophic or keloidal scars, there are several ways to treat and minimize their appearance. Methods include compression therapy, intralesional corticosteroids, laser therapy, or  surgery. These methods are performed by your caregiver.  Remember that the scar may appear lighter or darker than your normal skin color. This difference in color should even out with time. SEEK MEDICAL CARE IF:   You have an oral temperature above 102 F (38.9 C).   You develop signs of infection such as pain, redness, pus, and warmth.   You have questions or concerns.  Document Released: 05/04/2010 Document Revised: 07/13/2011 Document Reviewed: 05/04/2010 [facility] Patient Information 2012 [place], [place].Suture Removal Your caregiver has removed your sutures today. If skin adhesive strips were applied at the time of suturing, or applied following removal of the sutures today, they will begin to peel off in a couple more days. If skin adhesive strips remain after 14 days, they may be removed. HOME CARE INSTRUCTIONS   Change any bandages (dressings) it at least once a day or as directed by your caregiver. If the bandage sticks, soak it off with warm, soapy water.   Wash the area with soap and water to remove all the cream or ointment (if you were instructed to use any) 2 times a day. Rinse off the soap and pat the area dry with a clean towel.   Reapply cream or ointment as directed by your caregiver. This will help prevent infection and keep the bandage from sticking.   Keep the wound area dry and clean. If the bandage becomes wet, dirty, or develops a bad smell, change it as soon as possible.   Only take over-the-counter or prescription medicines for pain, discomfort, or fever as directed by your caregiver.   Use sunscreen when out in the sun. New scars become sunburned easily.   Return to your caregivers office in in 7 days or as directed to have your sutures removed.  You may need a tetanus shot if:  You cannot remember when you had your last tetanus shot.   You have never had a tetanus shot.   The injury broke your skin.  If you got a tetanus shot, your arm may swell, get red,  and feel warm to the touch. This is common and not a problem. If you need a tetanus shot and you choose not to have one, there is a rare chance of getting tetanus. Sickness from tetanus can be serious. SEEK IMMEDIATE MEDICAL CARE IF:   There is redness, swelling, or increasing pain in the wound.   Pus is coming from the wound.   An unexplained oral temperature above 102 F (38.9 C) develops.   You notice a bad smell coming from the wound or dressing.   The wound breaks open (edges not staying together) after sutures have been removed.  Document Released: 08/09/2001 Document Revised: 07/27/2011 Document Reviewed: 10/08/2007 [facility] Patient Information 2012 [place], [place].

## 2012-01-16 NOTE — ED Provider Notes (Signed)
Medical screening examination/treatment/procedure(s) were performed by non-physician practitioner and as supervising physician I was immediately available for consultation/collaboration.  Russ Looper, MD 01/16/12 0046 

## 2012-04-03 ENCOUNTER — Emergency Department (INDEPENDENT_AMBULATORY_CARE_PROVIDER_SITE_OTHER): Payer: Self-pay

## 2012-04-03 ENCOUNTER — Emergency Department (HOSPITAL_BASED_OUTPATIENT_CLINIC_OR_DEPARTMENT_OTHER)
Admission: EM | Admit: 2012-04-03 | Discharge: 2012-04-03 | Disposition: A | Discharge status: Home / Self Care | Payer: Self-pay | Attending: Emergency Medicine | Admitting: Emergency Medicine

## 2012-04-03 ENCOUNTER — Encounter (HOSPITAL_BASED_OUTPATIENT_CLINIC_OR_DEPARTMENT_OTHER): Payer: Self-pay

## 2012-04-03 DIAGNOSIS — R079 Chest pain, unspecified: Secondary | ICD-10-CM

## 2012-04-03 DIAGNOSIS — L259 Unspecified contact dermatitis, unspecified cause: Secondary | ICD-10-CM | POA: Insufficient documentation

## 2012-04-03 DIAGNOSIS — W19XXXA Unspecified fall, initial encounter: Secondary | ICD-10-CM

## 2012-04-03 DIAGNOSIS — M79609 Pain in unspecified limb: Secondary | ICD-10-CM | POA: Insufficient documentation

## 2012-04-03 DIAGNOSIS — W1789XA Other fall from one level to another, initial encounter: Secondary | ICD-10-CM

## 2012-04-03 DIAGNOSIS — Y990 Civilian activity done for income or pay: Secondary | ICD-10-CM | POA: Insufficient documentation

## 2012-04-03 DIAGNOSIS — S20219A Contusion of unspecified front wall of thorax, initial encounter: Secondary | ICD-10-CM | POA: Insufficient documentation

## 2012-04-03 HISTORY — DX: Accidental discharge from unspecified firearms or gun, initial encounter: W34.00XA

## 2012-04-03 HISTORY — DX: Unspecified firearm discharge, undetermined intent, initial encounter: Y24.9XXA

## 2012-04-03 MED ORDER — OXYCODONE-ACETAMINOPHEN 5-325 MG PO TABS: 1.0000 | ORAL_TABLET | Freq: Four times a day (QID) | ORAL | Status: AC | PRN

## 2012-04-03 MED ORDER — PREDNISONE 10 MG PO TABS: 20.0000 mg | ORAL_TABLET | Freq: Two times a day (BID) | ORAL | Status: DC

## 2012-04-03 NOTE — ED Notes (Signed)
Pt reports he fell out of a tree approx 18 ft to the ground and now has right rib and forearm pain. States he was also exposed to poison ivy and has a rash.

## 2012-04-03 NOTE — Discharge Instructions (Signed)
Chest Contusion You have been checked for injuries to your chest. Your caregiver has not found injuries serious enough to require hospitalization. It is common to have bruises and sore muscles after an injury. These tend to feel worse the first 24 hours. You may gradually develop more stiffness and soreness over the next several hours to several days. This usually feels worse the first morning following your injury. After a few days, you will usually begin to improve. The amount of improvement depends on the amount of damage. Following the accident, if the pain in any area continues to increase or you develop new areas of pain, you should see your primary caregiver or return to the Emergency Department for re-evaluation. HOME CARE INSTRUCTIONS   Put ice on sore areas every 2 hours for 20 minutes while awake for the next 2 days.   Drink extra fluids. Do not drink alcohol.   Activity as tolerated. Lifting may make pain worse.   Only take over-the-counter or prescription medicines for pain, discomfort, or fever as directed by your caregiver. Do not use aspirin. This may increase bruising or increase bleeding.  SEEK IMMEDIATE MEDICAL CARE IF:   There is a worsening of any of the problems that brought you in for care.   Shortness of breath, dizziness or fainting develop.   You have chest pain, difficulty breathing, or develop pain going down the left arm or up into jaw.   You feel sick to your stomach (nausea), vomiting or sweats.   You have increasing belly (abdominal) discomfort.   There is blood in your urine, stool, or if you vomit blood.   There is pain in either shoulder in an area where a shoulder strap would be.   You have feelings of lightheadedness, or if you should have a fainting episode.   You have numbness, tingling, weakness, or problems with the use of your arms or legs.   Severe headaches not relieved with medications develop.   You have a change in bowel or bladder  control.   There is increasing pain in any areas of the body.  If you feel your symptoms are worsening, and you are not able to see your primary caregiver, return to the Emergency Department immediately. MAKE SURE YOU:   Understand these instructions.   Will watch your condition.   Will get help right away if you are not doing well or get worse.  Document Released: 08/09/2001 Document Revised: 11/03/2011 Document Reviewed: 07/02/2008 [facility] Patient Information 2012 [place], [place].Contact Dermatitis Contact dermatitis is a rash that happens when something touches the skin. You touched something that irritates your skin, or you have allergies to something you touched. HOME CARE   Avoid the thing that caused your rash.   Keep your rash away from hot water, soap, sunlight, chemicals, and other things that might bother it.   Do not scratch your rash.   You can take cool baths to help stop itching.   Only take medicine as told by your doctor.   Keep all doctor visits as told.  GET HELP RIGHT AWAY IF:   Your rash is not better after 3 days.   Your rash gets worse.   Your rash is puffy (swollen), tender, red, sore, or warm.   You have problems with your medicine.  MAKE SURE YOU:   Understand these instructions.   Will watch your condition.   Will get help right away if you are not doing well or get worse.  Document  Released: 09/11/2009 Document Revised: 11/03/2011 Document Reviewed: 04/19/2011 [facility] Patient Information 2012 [place], [place].
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
## 2012-05-30 ENCOUNTER — Encounter (HOSPITAL_COMMUNITY)
Admission: EM | Disposition: A | Discharge status: Home / Self Care | Payer: Self-pay | Source: Home / Self Care | Attending: Emergency Medicine

## 2012-05-30 ENCOUNTER — Encounter (HOSPITAL_COMMUNITY): Payer: Self-pay | Admitting: Certified Registered Nurse Anesthetist

## 2012-05-30 ENCOUNTER — Emergency Department (HOSPITAL_BASED_OUTPATIENT_CLINIC_OR_DEPARTMENT_OTHER): Payer: Self-pay

## 2012-05-30 ENCOUNTER — Encounter (HOSPITAL_BASED_OUTPATIENT_CLINIC_OR_DEPARTMENT_OTHER): Payer: Self-pay | Admitting: Family Medicine

## 2012-05-30 ENCOUNTER — Observation Stay (HOSPITAL_BASED_OUTPATIENT_CLINIC_OR_DEPARTMENT_OTHER)
Admission: EM | Admit: 2012-05-30 | Discharge: 2012-05-31 | Disposition: A | Discharge status: Home / Self Care | Payer: Self-pay | Attending: General Surgery | Admitting: General Surgery

## 2012-05-30 ENCOUNTER — Observation Stay (HOSPITAL_COMMUNITY): Payer: Self-pay | Admitting: Certified Registered Nurse Anesthetist

## 2012-05-30 DIAGNOSIS — K37 Unspecified appendicitis: Secondary | ICD-10-CM

## 2012-05-30 DIAGNOSIS — K3580 Unspecified acute appendicitis: Secondary | ICD-10-CM

## 2012-05-30 DIAGNOSIS — F17200 Nicotine dependence, unspecified, uncomplicated: Secondary | ICD-10-CM | POA: Insufficient documentation

## 2012-05-30 HISTORY — PX: LAPAROSCOPIC APPENDECTOMY: SHX408

## 2012-05-30 LAB — COMPREHENSIVE METABOLIC PANEL
ALT: 9 U/L (ref 0–53)
Alkaline Phosphatase: 53 U/L (ref 39–117)
BUN: 13 mg/dL (ref 6–23)
CO2: 26 mEq/L (ref 19–32)
Chloride: 105 mEq/L (ref 96–112)
GFR calc Af Amer: >90 mL/min (ref >90)
GFR calc non Af Amer: >90 mL/min (ref >90)
Glucose, Bld: 110 mg/dL — ABNORMAL HIGH (ref 70–99)
Potassium: 3.6 mEq/L (ref 3.5–5.1)
Sodium: 140 mEq/L (ref 135–145)
Total Bilirubin: 0.8 mg/dL (ref 0.3–1.2)

## 2012-05-30 LAB — URINALYSIS, ROUTINE W REFLEX MICROSCOPIC
Bilirubin Urine: NEGATIVE
Ketones, ur: 15 mg/dL — AB
Nitrite: NEGATIVE
Protein, ur: 30 mg/dL — AB
Urobilinogen, UA: 0.2 mg/dL (ref 0.0–1.0)
pH: 6.5 (ref 5.0–8.0)

## 2012-05-30 LAB — CBC WITH DIFFERENTIAL/PLATELET
Hemoglobin: 13.8 g/dL (ref 13.0–17.0)
Lymphocytes Relative: 18 % (ref 12–46)
Lymphs Abs: 1.5 10*3/uL (ref 0.7–4.0)
MCH: 32.6 pg (ref 26.0–34.0)
Monocytes Relative: 10 % (ref 3–12)
Neutrophils Relative %: 70 % (ref 43–77)
Platelets: 242 10*3/uL (ref 150–400)
RBC: 4.23 MIL/uL (ref 4.22–5.81)
WBC: 8.2 10*3/uL (ref 4.0–10.5)

## 2012-05-30 LAB — URINE MICROSCOPIC-ADD ON

## 2012-05-30 SURGERY — APPENDECTOMY, LAPAROSCOPIC
Anesthesia: General | Wound class: Contaminated

## 2012-05-30 MED ORDER — ONDANSETRON HCL 4 MG/2ML IJ SOLN: 4.0000 mg | Freq: Four times a day (QID) | INTRAMUSCULAR | Status: DC | PRN

## 2012-05-30 MED ORDER — IOHEXOL 300 MG/ML  SOLN: 20.0000 mL | Freq: Once | INTRAMUSCULAR | Status: DC | PRN

## 2012-05-30 MED ORDER — GLYCOPYRROLATE 0.2 MG/ML IJ SOLN
INTRAMUSCULAR | Status: DC | PRN
  Administered 2012-05-30: .6 mg via INTRAVENOUS
  Administered 2012-05-30: 0.2 mg via INTRAVENOUS

## 2012-05-30 MED ORDER — ROCURONIUM BROMIDE 100 MG/10ML IV SOLN
INTRAVENOUS | Status: DC | PRN
  Administered 2012-05-30: 20 mg via INTRAVENOUS
  Administered 2012-05-30: 30 mg via INTRAVENOUS

## 2012-05-30 MED ORDER — ACETAMINOPHEN 10 MG/ML IV SOLN
1000.0000 mg | Freq: Four times a day (QID) | INTRAVENOUS | Status: AC
  Administered 2012-05-30 – 2012-05-31 (×2): 1000 mg via INTRAVENOUS
  Filled 2012-05-30 (×2): qty 100

## 2012-05-30 MED ORDER — ACETAMINOPHEN 325 MG PO TABS: 650.0000 mg | ORAL_TABLET | Freq: Four times a day (QID) | ORAL | Status: DC | PRN

## 2012-05-30 MED ORDER — NEOSTIGMINE METHYLSULFATE 1 MG/ML IJ SOLN
INTRAMUSCULAR | Status: DC | PRN
  Administered 2012-05-30: 1 mg via INTRAVENOUS
  Administered 2012-05-30: 4 mg via INTRAVENOUS

## 2012-05-30 MED ORDER — BUPIVACAINE HCL (PF) 0.5 % IJ SOLN
INTRAMUSCULAR | Status: DC | PRN
  Administered 2012-05-30: 15 mL

## 2012-05-30 MED ORDER — BUPIVACAINE HCL 0.5 % IJ SOLN
INTRAMUSCULAR | Status: AC
  Filled 2012-05-30: qty 1

## 2012-05-30 MED ORDER — IOHEXOL 300 MG/ML  SOLN: 100.0000 mL | Freq: Once | INTRAMUSCULAR | Status: DC | PRN

## 2012-05-30 MED ORDER — DEXAMETHASONE SODIUM PHOSPHATE 10 MG/ML IJ SOLN
INTRAMUSCULAR | Status: DC | PRN
  Administered 2012-05-30: 10 mg via INTRAVENOUS

## 2012-05-30 MED ORDER — ACETAMINOPHEN 10 MG/ML IV SOLN
INTRAVENOUS | Status: AC
  Filled 2012-05-30: qty 100

## 2012-05-30 MED ORDER — ONDANSETRON HCL 4 MG/2ML IJ SOLN
INTRAMUSCULAR | Status: DC | PRN
  Administered 2012-05-30: 4 mg via INTRAVENOUS

## 2012-05-30 MED ORDER — MIDAZOLAM HCL 5 MG/5ML IJ SOLN
INTRAMUSCULAR | Status: DC | PRN
  Administered 2012-05-30: 2 mg via INTRAVENOUS
  Administered 2012-05-30: 1 mg via INTRAVENOUS

## 2012-05-30 MED ORDER — ACETAMINOPHEN 650 MG RE SUPP: 650.0000 mg | Freq: Four times a day (QID) | RECTAL | Status: DC | PRN

## 2012-05-30 MED ORDER — LIDOCAINE HCL (CARDIAC) 20 MG/ML IV SOLN
INTRAVENOUS | Status: DC | PRN
  Administered 2012-05-30: 80 mg via INTRAVENOUS

## 2012-05-30 MED ORDER — LACTATED RINGERS IV SOLN
INTRAVENOUS | Status: DC
  Administered 2012-05-30 – 2012-05-31 (×2): via INTRAVENOUS

## 2012-05-30 MED ORDER — MEPERIDINE HCL 50 MG/ML IJ SOLN: 6.2500 mg | INTRAMUSCULAR | Status: DC | PRN

## 2012-05-30 MED ORDER — MOXIFLOXACIN HCL IN NACL 400 MG/250ML IV SOLN: 400.0000 mg | INTRAVENOUS | Status: DC

## 2012-05-30 MED ORDER — LACTATED RINGERS IV SOLN
INTRAVENOUS | Status: DC | PRN
  Administered 2012-05-30: 17:00:00 via INTRAVENOUS

## 2012-05-30 MED ORDER — PROMETHAZINE HCL 25 MG/ML IJ SOLN
6.2500 mg | INTRAMUSCULAR | Status: DC | PRN
  Filled 2012-05-30: qty 1

## 2012-05-30 MED ORDER — FENTANYL CITRATE 0.05 MG/ML IJ SOLN
INTRAMUSCULAR | Status: DC | PRN
  Administered 2012-05-30 (×2): 50 ug via INTRAVENOUS

## 2012-05-30 MED ORDER — OXYCODONE-ACETAMINOPHEN 5-325 MG PO TABS
1.0000 | ORAL_TABLET | ORAL | Status: DC | PRN
  Administered 2012-05-30 – 2012-05-31 (×3): 2 via ORAL
  Filled 2012-05-30 (×3): qty 2

## 2012-05-30 MED ORDER — PROPOFOL 10 MG/ML IV EMUL
INTRAVENOUS | Status: DC | PRN
  Administered 2012-05-30: 200 mg via INTRAVENOUS

## 2012-05-30 MED ORDER — MORPHINE SULFATE 2 MG/ML IJ SOLN
1.0000 mg | INTRAMUSCULAR | Status: DC | PRN
  Administered 2012-05-30 (×2): 2 mg via INTRAVENOUS
  Administered 2012-05-31: 3 mg via INTRAVENOUS
  Administered 2012-05-31 (×5): 2 mg via INTRAVENOUS
  Filled 2012-05-30 (×7): qty 1
  Filled 2012-05-30: qty 2

## 2012-05-30 MED ORDER — HYDROMORPHONE HCL PF 1 MG/ML IJ SOLN: 0.2500 mg | INTRAMUSCULAR | Status: DC | PRN

## 2012-05-30 MED ORDER — MORPHINE SULFATE 4 MG/ML IJ SOLN
4.0000 mg | Freq: Once | INTRAMUSCULAR | Status: AC
  Administered 2012-05-30: 4 mg via INTRAVENOUS
  Filled 2012-05-30: qty 1

## 2012-05-30 MED ORDER — OXYCODONE-ACETAMINOPHEN 5-325 MG PO TABS: 1.0000 | ORAL_TABLET | ORAL | Status: AC | PRN

## 2012-05-30 MED ORDER — SUCCINYLCHOLINE CHLORIDE 20 MG/ML IJ SOLN
INTRAMUSCULAR | Status: DC | PRN
  Administered 2012-05-30: 100 mg via INTRAVENOUS

## 2012-05-30 MED ORDER — ACETAMINOPHEN 10 MG/ML IV SOLN
INTRAVENOUS | Status: DC | PRN
  Administered 2012-05-30: 1000 mg via INTRAVENOUS

## 2012-05-30 MED ORDER — MOXIFLOXACIN HCL IN NACL 400 MG/250ML IV SOLN
400.0000 mg | Freq: Once | INTRAVENOUS | Status: AC
  Administered 2012-05-30: 400 mg via INTRAVENOUS
  Filled 2012-05-30: qty 250

## 2012-05-30 MED ORDER — ONDANSETRON HCL 4 MG/2ML IJ SOLN
4.0000 mg | Freq: Once | INTRAMUSCULAR | Status: AC
  Administered 2012-05-30: 4 mg via INTRAVENOUS
  Filled 2012-05-30: qty 2

## 2012-05-30 MED ORDER — LACTATED RINGERS IV SOLN
INTRAVENOUS | Status: DC | PRN
  Administered 2012-05-30: 1000 mL via INTRAVENOUS

## 2012-05-30 SURGICAL SUPPLY — 39 items
APPLIER CLIP 5 13 M/L LIGAMAX5 (MISCELLANEOUS) ×2
APPLIER CLIP ROT 10 11.4 M/L (STAPLE)
BENZOIN TINCTURE PRP APPL 2/3 (GAUZE/BANDAGES/DRESSINGS) ×2 IMPLANT
CANISTER SUCTION 2500CC (MISCELLANEOUS) ×2 IMPLANT
CHLORAPREP W/TINT 26ML (MISCELLANEOUS) ×2 IMPLANT
CLIP APPLIE 5 13 M/L LIGAMAX5 (MISCELLANEOUS) ×1 IMPLANT
CLIP APPLIE ROT 10 11.4 M/L (STAPLE) IMPLANT
CLOTH BEACON ORANGE TIMEOUT ST (SAFETY) ×2 IMPLANT
CUTTER FLEX LINEAR 45M (STAPLE) ×2 IMPLANT
DECANTER SPIKE VIAL GLASS SM (MISCELLANEOUS) ×2 IMPLANT
DRAPE LAPAROSCOPIC ABDOMINAL (DRAPES) ×2 IMPLANT
DRAPE UTILITY XL STRL (DRAPES) ×2 IMPLANT
DRSG TEGADERM 2-3/8X2-3/4 SM (GAUZE/BANDAGES/DRESSINGS) ×6 IMPLANT
ELECT REM PT RETURN 9FT ADLT (ELECTROSURGICAL) ×2
ELECTRODE REM PT RTRN 9FT ADLT (ELECTROSURGICAL) ×1 IMPLANT
ENDOLOOP SUT PDS II  0 18 (SUTURE)
ENDOLOOP SUT PDS II 0 18 (SUTURE) IMPLANT
GLOVE BIOGEL PI IND STRL 7.0 (GLOVE) ×1 IMPLANT
GLOVE BIOGEL PI INDICATOR 7.0 (GLOVE) ×1
GLOVE ECLIPSE 8.0 STRL XLNG CF (GLOVE) ×2 IMPLANT
GLOVE INDICATOR 8.0 STRL GRN (GLOVE) ×4 IMPLANT
GOWN STRL NON-REIN LRG LVL3 (GOWN DISPOSABLE) ×2 IMPLANT
GOWN STRL REIN XL XLG (GOWN DISPOSABLE) ×4 IMPLANT
KIT BASIN OR (CUSTOM PROCEDURE TRAY) ×2 IMPLANT
PENCIL BUTTON HOLSTER BLD 10FT (ELECTRODE) ×2 IMPLANT
POUCH SPECIMEN RETRIEVAL 10MM (ENDOMECHANICALS) ×2 IMPLANT
RELOAD 45 VASCULAR/THIN (ENDOMECHANICALS) IMPLANT
RELOAD STAPLE TA45 3.5 REG BLU (ENDOMECHANICALS) ×2 IMPLANT
SCALPEL HARMONIC ACE (MISCELLANEOUS) ×2 IMPLANT
SET IRRIG TUBING LAPAROSCOPIC (IRRIGATION / IRRIGATOR) ×2 IMPLANT
SOLUTION ANTI FOG 6CC (MISCELLANEOUS) ×2 IMPLANT
STRIP CLOSURE SKIN 1/2X4 (GAUZE/BANDAGES/DRESSINGS) ×2 IMPLANT
SUT MNCRL AB 4-0 PS2 18 (SUTURE) ×2 IMPLANT
TOWEL OR 17X26 10 PK STRL BLUE (TOWEL DISPOSABLE) ×2 IMPLANT
TRAY FOLEY CATH 14FRSI W/METER (CATHETERS) ×2 IMPLANT
TRAY LAP CHOLE (CUSTOM PROCEDURE TRAY) ×2 IMPLANT
TROCAR BLADELESS OPT 5 75 (ENDOMECHANICALS) ×4 IMPLANT
TROCAR XCEL BLUNT TIP 100MML (ENDOMECHANICALS) ×2 IMPLANT
TUBING INSUFFLATION 10FT LAP (TUBING) ×2 IMPLANT

## 2012-05-30 NOTE — Anesthesia Postprocedure Evaluation (Signed)
  Anesthesia Post-op Note  Patient: Jimmy Rogers  Procedure(s) Performed: Procedure(s) (LRB): APPENDECTOMY LAPAROSCOPIC (N/A)  Patient Location: PACU  Anesthesia Type: General  Level of Consciousness: awake and alert   Airway and Oxygen Therapy: Patient Spontanous Breathing  Post-op Pain: mild  Post-op Assessment: Post-op Vital signs reviewed, Patient's Cardiovascular Status Stable, Respiratory Function Stable, Patent Airway and No signs of Nausea or vomiting  Post-op Vital Signs: stable  Complications: No apparent anesthesia complications

## 2012-05-30 NOTE — ED Notes (Signed)
Pt reports RLQ pain which started last night.  Pt reports sudden onset of pain.  Pt reports that pain is severe, states that he thought it was food poisoning.  Took ex-lax.  Pt also reports f/c yesterday.  Denies n/v.  [facility]
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
## 2012-05-30 NOTE — H&P (Signed)
Patient seen and examined.  Plan laparoscopic possible open appendectomy.  I have discussed the procedure and risks of appendectomy. The risks include but are not limited to bleeding, infection, wound problems, anesthesia, injury to intra-abdominal organs, possibility of postoperative ileus.  We also discussed aftercare.  He seems to understand and agrees with the plan.

## 2012-05-30 NOTE — Preoperative (Signed)
Beta Blockers   Reason not to administer Beta Blockers:Not Applicable 

## 2012-05-30 NOTE — Anesthesia Preprocedure Evaluation (Signed)
Anesthesia Evaluation  Patient identified by MRN, date of birth, ID band Patient awake    Reviewed: Allergy & Precautions, H&P , NPO status , Patient's Chart, lab work & pertinent test results  Airway Mallampati: II TM Distance: >3 FB Neck ROM: Full    Dental No notable dental hx.    Pulmonary neg pulmonary ROS, Current Smoker,  breath sounds clear to auscultation  Pulmonary exam normal       Cardiovascular negative cardio ROS  Rhythm:Regular Rate:Normal     Neuro/Psych negative neurological ROS  negative psych ROS   GI/Hepatic negative GI ROS, Neg liver ROS,   Endo/Other  negative endocrine ROS  Renal/GU negative Renal ROS  negative genitourinary   Musculoskeletal negative musculoskeletal ROS (+)   Abdominal   Peds negative pediatric ROS (+)  Hematology negative hematology ROS (+)   Anesthesia Other Findings   Reproductive/Obstetrics negative OB ROS                           Anesthesia Physical Anesthesia Plan  ASA: II  Anesthesia Plan: General   Post-op Pain Management:    Induction: Intravenous  Airway Management Planned: Oral ETT  Additional Equipment:   Intra-op Plan:   Post-operative Plan: Extubation in OR  Informed Consent: I have reviewed the patients History and Physical, chart, labs and discussed the procedure including the risks, benefits and alternatives for the proposed anesthesia with the patient or authorized representative who has indicated his/her understanding and acceptance.   Dental advisory given  Plan Discussed with: CRNA  Anesthesia Plan Comments:         Anesthesia Quick Evaluation  

## 2012-05-30 NOTE — ED Notes (Addendum)
Pt c/o lower abdominal pain sharp and constant since last night. Pt sts pain is less severe and mostly with movement today. Pt denies urinary sx, denies injury. Pt sts he did vomit 4x yesterday.

## 2012-05-30 NOTE — ED Notes (Signed)
MD at bedside. Rosenbower CCS

## 2012-05-30 NOTE — Op Note (Signed)
Appendectomy, Lap, Procedure Note  Pre-operative Diagnosis:   Acute appendicitis  Post-operative Diagnosis:   Same  Surgeon:  Avel Peace, M.D.  Anesthesia:  General   Indications:  This is a 31 year old male who had abrupt onset of right lower quadrant pain that progressively worsened and was associated with nausea and vomiting. He presented to an outside facility and on CT scan had findings consistent with acute appendicitis. He was transferred here for definitive treatment. He is now brought to the operating room for laparoscopic possible open appendectomy.    Surgeon: Adolph Pollack   Assistants:  None  Anesthesia: General endotracheal anesthesia  ASA Class: 1  Procedure Details  The patient was seen again in the Holding Room.  The patient was taken to Operating Room, identified as Jimmy Rogers and the procedure verified as Appendectomy.  The patient was placed in the supine position and general anesthesia was induced, along with placement of orogastric tube, SCDs, and a Foley catheter. The abdomen was prepped and draped in a sterile fashion.  Marcaine was infiltrated in the subumbilical region for local anesthetic effect. A small infraumbilical incision was made through the skin, subcutaneous tissue, fascia, and peritoneum entering the peritoneal cavity under direct vision.  A pursestring suture was passed around the incision with a 0 Vicryl.  The Hasson was introduced into the peritoneal cavity and the tails of the suture were used to hold the Hasson in place.   The pneumoperitoneum was then established to steady pressure of 15 mmHg.  Additional 5 mm cannulas then placed in the left lower quadrant of the abdomen and the right upper quadrant region under direct visualization and after infiltration of Marcaine solution for local anesthetic effect.. A careful evaluation of the entire abdomen was carried out. The patient was placed in Trendelenburg and left lateral decubitus  position. The small intestines were retracted in the cephalad and left lateral direction away from the pelvis and right lower quadrant. The patient was found to have an enlarged and inflamed appendix that was extending into the pelvis. There was no evidence of perforation.  The appendix was carefully mobilized. The mesoappendix was was divided with the harmonic scalpel.   The appendix was amputated off the cecum, with a small cuff of cecum, using an endo-GIA stapler.  There was no evidence of bleeding, leakage, or complication after division of the appendix. Copious irrigation was  performed and irrigant fluid suctioned from the abdomen as much as possible.  The umbilical trocar was removed and the  port site fascia was closed via the purse string suture under laparoscopic vision. There was no residual palpable fascial defect.  The remaining trocars were removed and all  trocar site skin wounds were closed with 4-0 Monocryl.  Instrument, sponge, and needle counts were correct at the conclusion of the case.   Findings: The appendix was found to be inflamed. There were not signs of necrosis.  There was not perforation. There was not abscess formation.  Estimated Blood Loss:  less than 100 mL         Drains: None                Specimens: Appendix         Complications:  None; patient tolerated the procedure well.         Disposition: PACU - hemodynamically stable.         Condition: stable

## 2012-05-30 NOTE — ED Provider Notes (Signed)
Medical screening examination/treatment/procedure(s) were performed by non-physician practitioner and as supervising physician I was immediately available for consultation/collaboration.   Charles B. Bernette Mayers, MD 05/30/12 1529

## 2012-05-30 NOTE — Transfer of Care (Signed)
Immediate Anesthesia Transfer of Care Note  Patient: Jimmy Rogers  Procedure(s) Performed: Procedure(s) (LRB): APPENDECTOMY LAPAROSCOPIC (N/A)  Patient Location: PACU  Anesthesia Type: General  Level of Consciousness: sedated, patient cooperative and responds to stimulaton  Airway & Oxygen Therapy: Patient Spontanous Breathing and Patient connected to face mask oxgen  Post-op Assessment: Report given to PACU RN and Post -op Vital signs reviewed and stable  Post vital signs: Reviewed and stable  Complications: No apparent anesthesia complications
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
## 2012-05-30 NOTE — ED Notes (Addendum)
MD at bedside. Will PA

## 2012-05-31 MED ORDER — OXYCODONE-ACETAMINOPHEN 5-325 MG PO TABS: 1.0000 | ORAL_TABLET | ORAL | Status: AC | PRN

## 2012-05-31 NOTE — Progress Notes (Signed)
Pt verbalized understanding of discharge instructions. Pt was given d/c forms and prescription. Pt assessment has not changed from am.

## 2012-05-31 NOTE — Discharge Summary (Signed)
Physician Discharge Summary [facility]  Patient ID: TIVIS WHERRY MRN: [id number] DOB/AGE: 1981-11-15 31 y.o.  Admit date: 05/30/2012 Discharge date: 05/31/2012  Admission Diagnoses:  Acute appendicitis  Discharge Diagnoses:  Active Problems:  * No active hospital problems. *    Discharged Condition: good  Hospital Course: patient admitted for observation following laparoscopic appendectomy.  Post op course stable with good pain control with IV and po narcotics.  Tolerated regular diet AM after surgery.  Prepared for discharge home on POD#1.  Consults: None  Significant Diagnostic Studies: none  Treatments: surgery: lap appendectomy  Discharge Exam: Blood pressure 114/70, pulse 68, temperature 97.2 F (36.2 C), temperature source Oral, resp. rate 20, height 5\' 8"  (1.727 m), weight 169 lb 12.1 oz (77 kg), SpO2 100.00%. HEENT - clear Chest - clear bilat Cor - RRR Abd - soft without distension; dressings dry and intact; mild tenderness RLQ  Disposition: Home with family  Discharge Orders    Future Orders Please Complete By Expires   Diet - low sodium heart healthy      Increase activity slowly      Discharge instructions      Comments:   [facility] LAPAROSCOPIC SURGERY: POST OP INSTRUCTIONS  Always review your discharge instruction sheet given to you by the facility where your surgery was performed.  A prescription for pain medication may be given to you upon discharge.  Take your pain medication as prescribed, if needed.  If narcotic pain medicine is not needed, then you may take acetaminophen (Tylenol) or ibuprofen (Advil) as needed. Take your usually prescribed medications unless otherwise directed. If you need a refill on your pain medication, please contact your pharmacy.  They will contact our office to request authorization. Prescriptions will not be filled after 5pm or on week-ends. You should follow a light diet the first  few days after arrival home, such as soup and crackers, etc.  Be sure to include lots of fluids daily. Most patients will experience some swelling and bruising in the area of the incisions.  Ice packs will help.  Swelling and bruising can take several days to resolve.  It is common to experience some constipation if taking pain medication after surgery.  Increasing fluid intake and taking a stool softener (such as Colace) will usually help or prevent this problem from occurring.  A mild laxative (Milk of Magnesia or Miralax) should be taken according to package instructions if there are no bowel movements after 48 hours. Unless discharge instructions indicate otherwise, you may remove your bandages 24-48 hours after surgery, and you may shower at that time.  You may have steri-strips (small skin tapes) in place directly over the incision.  These strips should be left on the skin for 7-10 days.  If your surgeon used skin glue on the incision, you may shower in 24 hours.  The glue will flake off over the next 2-3 weeks.  Any sutures or staples will be removed at the office during your follow-up visit. ACTIVITIES:  You may resume regular (light) daily activities beginning the next day-such as daily self-care, walking, climbing stairs-gradually increasing activities as tolerated.  You may have sexual intercourse when it is comfortable.  Refrain from any heavy lifting or straining until approved by your doctor. You may drive when you are no longer taking prescription pain medication, you can comfortably wear a seatbelt, and you can safely maneuver your car and apply brakes. You should see your doctor  in the office for a follow-up appointment approximately 2-3 weeks after your surgery.  Make sure that you call for this appointment within a day or two after you arrive home to insure a convenient appointment time.  WHEN TO CALL YOUR DOCTOR: Fever over 101.0 Inability to urinate Continued bleeding from  incision. Increased pain, redness, or drainage from the incision. Increasing abdominal pain  The clinic staff is available to answer your questions during regular business hours.  Please don't hesitate to call and ask to speak to one of the nurses for clinical concerns.  If you have a medical emergency, go to the nearest emergency room or call 911.  A surgeon from [facility] is always on call at the hospital. [phone] ? [phone] ? FAX [phone] Web site: [email or web address]   Remove dressing in 24 hours      Comments:   May begin showers after dressings removed.  Leave Steri-strips one week if present.     Medication List  As of 05/31/2012  9:33 AM   TAKE these medications         bisacodyl 5 MG EC tablet   Commonly known as: DULCOLAX   Take 10 mg by mouth daily as needed. Constipation, stomach pain      ibuprofen 200 MG tablet   Commonly known as: ADVIL,MOTRIN   Take 600 mg by mouth every 6 (six) hours as needed. Stomach pain      multivitamin with minerals Tabs   Take 1 tablet by mouth daily.      oxyCODONE-acetaminophen 5-325 MG per tablet   Commonly known as: PERCOCET   Take 1-2 tablets by mouth every 4 (four) hours as needed for pain.      oxyCODONE-acetaminophen 5-325 MG per tablet   Commonly known as: PERCOCET   Take 1-2 tablets by mouth every 4 (four) hours as needed for pain.           Velora Heckler, MD, [facility] Office: [phone]   Signed: Velora Heckler 05/31/2012, 9:33 AM

## 2012-06-01 ENCOUNTER — Encounter (HOSPITAL_COMMUNITY): Payer: Self-pay | Admitting: General Surgery

## 2012-06-01 LAB — URINE CULTURE: Culture: NO GROWTH

## 2012-06-04 ENCOUNTER — Encounter (HOSPITAL_COMMUNITY): Payer: Self-pay | Admitting: Emergency Medicine

## 2012-06-04 ENCOUNTER — Emergency Department (HOSPITAL_COMMUNITY)
Admission: EM | Admit: 2012-06-04 | Discharge: 2012-06-04 | Disposition: A | Discharge status: Home / Self Care | Payer: Self-pay | Attending: Emergency Medicine | Admitting: Emergency Medicine

## 2012-06-04 DIAGNOSIS — Z9089 Acquired absence of other organs: Secondary | ICD-10-CM | POA: Insufficient documentation

## 2012-06-04 DIAGNOSIS — R109 Unspecified abdominal pain: Secondary | ICD-10-CM

## 2012-06-04 DIAGNOSIS — R1031 Right lower quadrant pain: Secondary | ICD-10-CM | POA: Insufficient documentation

## 2012-06-04 DIAGNOSIS — F17200 Nicotine dependence, unspecified, uncomplicated: Secondary | ICD-10-CM | POA: Insufficient documentation

## 2012-06-04 MED ORDER — OXYCODONE-ACETAMINOPHEN 5-325 MG PO TABS: 6.0000 | ORAL_TABLET | ORAL | Status: DC | PRN

## 2012-06-04 MED ORDER — TRAMADOL HCL 50 MG PO TABS: 50.0000 mg | ORAL_TABLET | Freq: Four times a day (QID) | ORAL | Status: AC | PRN

## 2012-06-04 MED ORDER — OXYCODONE-ACETAMINOPHEN 5-325 MG PO TABS: 1.0000 | ORAL_TABLET | ORAL | Status: AC | PRN

## 2012-06-04 MED ORDER — IBUPROFEN 800 MG PO TABS: 800.0000 mg | ORAL_TABLET | Freq: Three times a day (TID) | ORAL | Status: AC
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
## 2012-06-04 NOTE — ED Notes (Signed)
Pt had appendectomy last Wednesday, states ran out of pain medication last night and has not been to sleep since. Pt has 3 small incision sites, no s/s of infection. No redness or swelling noted. Pt tender with palpation of RLQ. States is supposed to f/u with surgeon in 2 weeks.

## 2012-06-04 NOTE — ED Notes (Signed)
Pt presenting to ed with c/o pain at surgical site pt states he had appendectomy x 4-5 days ago. Pt states he is out of his pain medication and the pain is severe. Pt denies fever, positive chills, no nausea or vomiting. Pt with bandages noted on surgical sites

## 2012-06-05 NOTE — ED Provider Notes (Signed)
Medical screening examination/treatment/procedure(s) were performed by non-physician practitioner and as supervising physician I was immediately available for consultation/collaboration.  Raeford Razor, MD 06/05/12 [phone]
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
## 2012-12-10 ENCOUNTER — Encounter (HOSPITAL_BASED_OUTPATIENT_CLINIC_OR_DEPARTMENT_OTHER): Payer: Self-pay | Admitting: *Deleted

## 2012-12-10 ENCOUNTER — Emergency Department (HOSPITAL_BASED_OUTPATIENT_CLINIC_OR_DEPARTMENT_OTHER)
Admission: EM | Admit: 2012-12-10 | Discharge: 2012-12-10 | Disposition: A | Discharge status: Home / Self Care | Payer: Self-pay | Attending: Emergency Medicine | Admitting: Emergency Medicine

## 2012-12-10 DIAGNOSIS — S39012A Strain of muscle, fascia and tendon of lower back, initial encounter: Secondary | ICD-10-CM

## 2012-12-10 DIAGNOSIS — S139XXA Sprain of joints and ligaments of unspecified parts of neck, initial encounter: Secondary | ICD-10-CM | POA: Insufficient documentation

## 2012-12-10 DIAGNOSIS — Z87828 Personal history of other (healed) physical injury and trauma: Secondary | ICD-10-CM | POA: Insufficient documentation

## 2012-12-10 DIAGNOSIS — F17200 Nicotine dependence, unspecified, uncomplicated: Secondary | ICD-10-CM | POA: Insufficient documentation

## 2012-12-10 DIAGNOSIS — S161XXA Strain of muscle, fascia and tendon at neck level, initial encounter: Secondary | ICD-10-CM

## 2012-12-10 DIAGNOSIS — Y9289 Other specified places as the place of occurrence of the external cause: Secondary | ICD-10-CM | POA: Insufficient documentation

## 2012-12-10 DIAGNOSIS — S335XXA Sprain of ligaments of lumbar spine, initial encounter: Secondary | ICD-10-CM | POA: Insufficient documentation

## 2012-12-10 DIAGNOSIS — X500XXA Overexertion from strenuous movement or load, initial encounter: Secondary | ICD-10-CM | POA: Insufficient documentation

## 2012-12-10 DIAGNOSIS — Y939 Activity, unspecified: Secondary | ICD-10-CM | POA: Insufficient documentation

## 2012-12-10 MED ORDER — CYCLOBENZAPRINE HCL 10 MG PO TABS: 10.0000 mg | ORAL_TABLET | Freq: Three times a day (TID) | ORAL | Status: DC | PRN

## 2012-12-10 MED ORDER — OXYCODONE-ACETAMINOPHEN 5-325 MG PO TABS: 1.0000 | ORAL_TABLET | ORAL | Status: AC | PRN
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
## 2012-12-10 NOTE — ED Notes (Signed)
Pt c/o back pain x 3 days

## 2013-06-13 ENCOUNTER — Encounter (HOSPITAL_COMMUNITY): Payer: Self-pay

## 2013-06-13 ENCOUNTER — Emergency Department (HOSPITAL_COMMUNITY)
Admission: EM | Admit: 2013-06-13 | Discharge: 2013-06-14 | Disposition: A | Discharge status: Home / Self Care | Payer: Self-pay | Attending: Emergency Medicine | Admitting: Emergency Medicine

## 2013-06-13 DIAGNOSIS — F17200 Nicotine dependence, unspecified, uncomplicated: Secondary | ICD-10-CM | POA: Insufficient documentation

## 2013-06-13 DIAGNOSIS — F411 Generalized anxiety disorder: Secondary | ICD-10-CM | POA: Insufficient documentation

## 2013-06-13 DIAGNOSIS — F1410 Cocaine abuse, uncomplicated: Secondary | ICD-10-CM | POA: Insufficient documentation

## 2013-06-13 DIAGNOSIS — Z79899 Other long term (current) drug therapy: Secondary | ICD-10-CM | POA: Insufficient documentation

## 2013-06-13 DIAGNOSIS — Z87828 Personal history of other (healed) physical injury and trauma: Secondary | ICD-10-CM | POA: Insufficient documentation

## 2013-06-13 HISTORY — DX: Depression, unspecified: F32.A

## 2013-06-13 HISTORY — DX: Major depressive disorder, single episode, unspecified: F32.9

## 2013-06-13 HISTORY — DX: Cocaine abuse, uncomplicated: F14.10

## 2013-06-13 HISTORY — DX: Bitten by dog, initial encounter: W54.0XXA

## 2013-06-13 LAB — COMPREHENSIVE METABOLIC PANEL
AST: 12 U/L (ref 0–37)
CO2: 31 mEq/L (ref 19–32)
Calcium: 9.6 mg/dL (ref 8.4–10.5)
Creatinine, Ser: 1.23 mg/dL (ref 0.50–1.35)
GFR calc non Af Amer: 78 mL/min — ABNORMAL LOW (ref >90)
Total Protein: 7.2 g/dL (ref 6.0–8.3)

## 2013-06-13 LAB — RAPID URINE DRUG SCREEN, HOSP PERFORMED
Amphetamines: NOT DETECTED
Barbiturates: NOT DETECTED
Benzodiazepines: POSITIVE — AB
Cocaine: POSITIVE — AB

## 2013-06-13 LAB — CBC WITH DIFFERENTIAL/PLATELET
Basophils Absolute: 0 10*3/uL (ref 0.0–0.1)
Basophils Relative: 1 % (ref 0–1)
Eosinophils Absolute: 0.2 10*3/uL (ref 0.0–0.7)
Eosinophils Relative: 3 % (ref 0–5)
HCT: 45.5 % (ref 39.0–52.0)
Lymphocytes Relative: 24 % (ref 12–46)
MCH: 31.8 pg (ref 26.0–34.0)
MCHC: 33 g/dL (ref 30.0–36.0)
MCV: 96.4 fL (ref 78.0–100.0)
Monocytes Absolute: 0.7 10*3/uL (ref 0.1–1.0)
Platelets: 266 10*3/uL (ref 150–400)
RDW: 13.7 % (ref 11.5–15.5)

## 2013-06-13 MED ORDER — LORAZEPAM 1 MG PO TABS
1.0000 mg | ORAL_TABLET | Freq: Three times a day (TID) | ORAL | Status: DC | PRN
  Administered 2013-06-13: 1 mg via ORAL
  Filled 2013-06-13: qty 1

## 2013-06-13 MED ORDER — LORAZEPAM 1 MG PO TABS
1.0000 mg | ORAL_TABLET | Freq: Once | ORAL | Status: AC
  Administered 2013-06-13: 1 mg via ORAL
  Filled 2013-06-13: qty 1

## 2013-06-13 MED ORDER — NICOTINE 21 MG/24HR TD PT24
21.0000 mg | MEDICATED_PATCH | Freq: Every day | TRANSDERMAL | Status: DC
  Administered 2013-06-13: 21 mg via TRANSDERMAL
  Filled 2013-06-13: qty 1
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
## 2013-06-13 NOTE — ED Notes (Signed)
When writer asked pt if he was SI he said "not right now, no", when Clinical research associate asked what that meant he talked about his anxiety being high. When writer asked how the high anxiety related to SI he said if I have to leave here I'll self medicate and maybe OD. He reports his mom's friend OD and passed away from cocaine abuse the other day. He says he was seen at the [facility] and has an appointment with a psychologist tomorrow but he came here because it scared his mom when the friend OD from cocaine and she wants him to get help. Pt also said if he didn't get Ativan like he wanted he'd leave but was fine with just the 1 mg and the EDP not increasing his dose. He reports not having any xanax for four days and last use of cocaine the other night. When asked if he was HI pt responded "I'm fine as long as I get my meds and can relax". He reports using 2-300$ worth of cocaine daily. He says his family has money. His grandfather has a business and that's where he was working but not anymore d/t his drug use. He says he wants to get his meds and quit using cocaine and make his family proud and live a happy life.

## 2013-06-13 NOTE — ED Notes (Signed)
Up to the bathroom 

## 2013-06-13 NOTE — ED Notes (Signed)
Pt didn't say anything really when writer told him percocet will not be ordered and Ativan dose will not be increased and it is every 8 hours. He said he'll take the Tyelonol its better than nothing. He asked about the sleep med and writer told him EDP wants him to see ACT CSW first.   His mom just called to see if he'd been assessed yet. Consent signed so informed her no and writer trying to get a hold of the ACT CSW to see when pt might be assessed.

## 2013-06-13 NOTE — ED Notes (Signed)
Called EDP to request increase in Ativan order per pt request as well as sleep meds and pain meds percocet 5/325. Pt reports the Ativan 1mg  didn't help him. He's used to taking 2 mg of Xanax TID. He said if he can't get the Ativan he wants he'll leave. EDP is not increasing Ativan dose. Tried to call ACT CSW to see when she's going to see pt and didn't get an answer. Pt also said earlier the MD he saw said they were ordering pain meds for his foot, writer looked for a note to that affect and couldn't find one.

## 2013-06-13 NOTE — ED Notes (Signed)
No belongings to move to into new locker.

## 2013-06-13 NOTE — ED Notes (Signed)
[id number]> Expected date:<BR> Expected time:<BR> Means of arrival:<BR> Comments:<BR> ems- Hispanic pt, limited [place]. SI

## 2013-06-13 NOTE — ED Notes (Addendum)
Patient is requesting detox from cocaine. Patient states he last used last night. Patient drank a 12 pack yesterday pm. Patient reports that he has had detox before. Patient states he is suicidal and does not have a plan. Patient denies HI, hearing voices or having any visual hallucinations.

## 2013-06-13 NOTE — ED Notes (Signed)
Spoke to ACT CSW about pt and she will see him. Told her EDP said he's likely a d/c with referrals and he lives in [place]. Also let her know he told RN earlier if he left here w/o meds he's going to use cocaine and may or may not wake up. He told this Clinical research associate the same thing as well.

## 2013-06-13 NOTE — ED Notes (Signed)
Wanded by security. Patient in blue scrubs. Belongings bag x 1 given to patient's mother.

## 2013-06-14 ENCOUNTER — Encounter (HOSPITAL_COMMUNITY): Payer: Self-pay | Admitting: *Deleted

## 2013-06-14 DIAGNOSIS — F1410 Cocaine abuse, uncomplicated: Secondary | ICD-10-CM

## 2013-06-14 DIAGNOSIS — F329 Major depressive disorder, single episode, unspecified: Secondary | ICD-10-CM

## 2013-06-14 NOTE — Progress Notes (Signed)
[facility] CL has seen patient and spoken with him about [facility] card program. Provided him with the application.
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
## 2013-06-14 NOTE — ED Notes (Signed)
Pt came up to desk wanting to use the phone because the [facility] CSW said he was going to discharge and he could either stay until morning or get a ride tonight if he could. [facility] CSW hasn't conveyed this information to Clinical research associate. Writer assuming she's currently calling EDP and putting in her assessment. Told pt let's wait to make sure EDP puts him up for discharge like five minutes. Pt said ok but just came back to nursing station stating he'd rather respect his grandmother and not call her this late but just stay until morning and asked that he's TV be turned off and he'll be "lights out".

## 2013-07-24 ENCOUNTER — Emergency Department (HOSPITAL_BASED_OUTPATIENT_CLINIC_OR_DEPARTMENT_OTHER)
Admission: EM | Admit: 2013-07-24 | Discharge: 2013-07-24 | Disposition: A | Discharge status: Home / Self Care | Payer: Self-pay | Attending: Emergency Medicine | Admitting: Emergency Medicine

## 2013-07-24 ENCOUNTER — Emergency Department (HOSPITAL_BASED_OUTPATIENT_CLINIC_OR_DEPARTMENT_OTHER): Payer: Self-pay

## 2013-07-24 ENCOUNTER — Encounter (HOSPITAL_BASED_OUTPATIENT_CLINIC_OR_DEPARTMENT_OTHER): Payer: Self-pay | Admitting: *Deleted

## 2013-07-24 DIAGNOSIS — M549 Dorsalgia, unspecified: Secondary | ICD-10-CM | POA: Insufficient documentation

## 2013-07-24 DIAGNOSIS — Z8659 Personal history of other mental and behavioral disorders: Secondary | ICD-10-CM | POA: Insufficient documentation

## 2013-07-24 DIAGNOSIS — M79671 Pain in right foot: Secondary | ICD-10-CM

## 2013-07-24 DIAGNOSIS — Z9889 Other specified postprocedural states: Secondary | ICD-10-CM | POA: Insufficient documentation

## 2013-07-24 DIAGNOSIS — F17200 Nicotine dependence, unspecified, uncomplicated: Secondary | ICD-10-CM | POA: Insufficient documentation

## 2013-07-24 DIAGNOSIS — F1410 Cocaine abuse, uncomplicated: Secondary | ICD-10-CM | POA: Insufficient documentation

## 2013-07-24 DIAGNOSIS — Z87828 Personal history of other (healed) physical injury and trauma: Secondary | ICD-10-CM | POA: Insufficient documentation

## 2013-07-24 DIAGNOSIS — M25579 Pain in unspecified ankle and joints of unspecified foot: Secondary | ICD-10-CM | POA: Insufficient documentation

## 2013-07-24 MED ORDER — OXYCODONE-ACETAMINOPHEN 5-325 MG PO TABS: 1.0000 | ORAL_TABLET | Freq: Four times a day (QID) | ORAL | Status: DC | PRN

## 2013-07-24 MED ORDER — NAPROXEN 500 MG PO TABS: 500.0000 mg | ORAL_TABLET | Freq: Two times a day (BID) | ORAL | Status: DC

## 2013-07-24 NOTE — ED Notes (Signed)
Pt c/o right planter foot pain x 1 month severe x 1 day
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
## 2013-11-10 ENCOUNTER — Emergency Department (HOSPITAL_BASED_OUTPATIENT_CLINIC_OR_DEPARTMENT_OTHER)
Admission: EM | Admit: 2013-11-10 | Discharge: 2013-11-10 | Disposition: A | Discharge status: Home / Self Care | Payer: PRIVATE HEALTH INSURANCE | Attending: Emergency Medicine | Admitting: Emergency Medicine

## 2013-11-10 ENCOUNTER — Encounter (HOSPITAL_BASED_OUTPATIENT_CLINIC_OR_DEPARTMENT_OTHER): Payer: Self-pay | Admitting: Emergency Medicine

## 2013-11-10 ENCOUNTER — Emergency Department (HOSPITAL_BASED_OUTPATIENT_CLINIC_OR_DEPARTMENT_OTHER): Payer: PRIVATE HEALTH INSURANCE

## 2013-11-10 DIAGNOSIS — M79609 Pain in unspecified limb: Secondary | ICD-10-CM | POA: Insufficient documentation

## 2013-11-10 DIAGNOSIS — Z87891 Personal history of nicotine dependence: Secondary | ICD-10-CM | POA: Insufficient documentation

## 2013-11-10 DIAGNOSIS — Z791 Long term (current) use of non-steroidal anti-inflammatories (NSAID): Secondary | ICD-10-CM | POA: Insufficient documentation

## 2013-11-10 DIAGNOSIS — Z8659 Personal history of other mental and behavioral disorders: Secondary | ICD-10-CM | POA: Insufficient documentation

## 2013-11-10 DIAGNOSIS — S20211A Contusion of right front wall of thorax, initial encounter: Secondary | ICD-10-CM

## 2013-11-10 DIAGNOSIS — S139XXA Sprain of joints and ligaments of unspecified parts of neck, initial encounter: Secondary | ICD-10-CM | POA: Insufficient documentation

## 2013-11-10 DIAGNOSIS — S161XXA Strain of muscle, fascia and tendon at neck level, initial encounter: Secondary | ICD-10-CM

## 2013-11-10 DIAGNOSIS — Y9389 Activity, other specified: Secondary | ICD-10-CM | POA: Insufficient documentation

## 2013-11-10 DIAGNOSIS — S20219A Contusion of unspecified front wall of thorax, initial encounter: Secondary | ICD-10-CM | POA: Insufficient documentation

## 2013-11-10 DIAGNOSIS — G8929 Other chronic pain: Secondary | ICD-10-CM | POA: Insufficient documentation

## 2013-11-10 DIAGNOSIS — Y92410 Unspecified street and highway as the place of occurrence of the external cause: Secondary | ICD-10-CM | POA: Insufficient documentation

## 2013-11-10 MED ORDER — OXYCODONE-ACETAMINOPHEN 5-325 MG PO TABS
2.0000 | ORAL_TABLET | Freq: Once | ORAL | Status: AC
  Administered 2013-11-10: 2 via ORAL
  Filled 2013-11-10: qty 2

## 2013-11-10 MED ORDER — OXYCODONE-ACETAMINOPHEN 5-325 MG PO TABS: 2.0000 | ORAL_TABLET | Freq: Four times a day (QID) | ORAL | Status: DC | PRN
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
## 2014-10-02 ENCOUNTER — Emergency Department (HOSPITAL_BASED_OUTPATIENT_CLINIC_OR_DEPARTMENT_OTHER): Payer: Self-pay

## 2014-10-02 ENCOUNTER — Emergency Department (HOSPITAL_BASED_OUTPATIENT_CLINIC_OR_DEPARTMENT_OTHER)
Admission: EM | Admit: 2014-10-02 | Discharge: 2014-10-02 | Disposition: A | Discharge status: Home / Self Care | Payer: Self-pay | Attending: Emergency Medicine | Admitting: Emergency Medicine

## 2014-10-02 ENCOUNTER — Encounter (HOSPITAL_BASED_OUTPATIENT_CLINIC_OR_DEPARTMENT_OTHER): Payer: Self-pay | Admitting: *Deleted

## 2014-10-02 DIAGNOSIS — Y9289 Other specified places as the place of occurrence of the external cause: Secondary | ICD-10-CM | POA: Insufficient documentation

## 2014-10-02 DIAGNOSIS — S2232XA Fracture of one rib, left side, initial encounter for closed fracture: Secondary | ICD-10-CM | POA: Insufficient documentation

## 2014-10-02 DIAGNOSIS — X58XXXA Exposure to other specified factors, initial encounter: Secondary | ICD-10-CM | POA: Insufficient documentation

## 2014-10-02 DIAGNOSIS — Z720 Tobacco use: Secondary | ICD-10-CM | POA: Insufficient documentation

## 2014-10-02 DIAGNOSIS — Z8659 Personal history of other mental and behavioral disorders: Secondary | ICD-10-CM | POA: Insufficient documentation

## 2014-10-02 DIAGNOSIS — Y9372 Activity, wrestling: Secondary | ICD-10-CM | POA: Insufficient documentation

## 2014-10-02 DIAGNOSIS — R52 Pain, unspecified: Secondary | ICD-10-CM

## 2014-10-02 MED ORDER — OXYCODONE-ACETAMINOPHEN 5-325 MG PO TABS: 1.0000 | ORAL_TABLET | Freq: Four times a day (QID) | ORAL | Status: DC | PRN

## 2014-10-02 NOTE — ED Notes (Signed)
Patient transported to X-ray 

## 2014-10-02 NOTE — Discharge Instructions (Signed)
Blunt Chest Trauma °Blunt chest trauma is an injury caused by a blow to the chest. These chest injuries can be very painful. Blunt chest trauma often results in bruised or broken (fractured) ribs. Most cases of bruised and fractured ribs from blunt chest traumas get better after 1 to 3 weeks of rest and pain medicine. Often, the soft tissue in the chest wall is also injured, causing pain and bruising. Internal organs, such as the heart and lungs, may also be injured. Blunt chest trauma can lead to serious medical problems. This injury requires immediate medical care. °CAUSES  °· Motor vehicle collisions. °· Falls. °· Physical violence. °· Sports injuries. °SYMPTOMS  °· Chest pain. The pain may be worse when you move or breathe deeply. °· Shortness of breath. °· Lightheadedness. °· Bruising. °· Tenderness. °· Swelling. °DIAGNOSIS  °Your caregiver will do a physical exam. X-rays may be taken to look for fractures. However, minor rib fractures may not show up on X-rays until a few days after the injury. If a more serious injury is suspected, further imaging tests may be done. This may include ultrasounds, computed tomography (CT) scans, or magnetic resonance imaging (MRI). °TREATMENT  °Treatment depends on the severity of your injury. Your caregiver may prescribe pain medicines and deep breathing exercises. °HOME CARE INSTRUCTIONS °· Limit your activities until you can move around without much pain. °· Do not do any strenuous work until your injury is healed. °· Put ice on the injured area. °¨ Put ice in a plastic bag. °¨ Place a towel between your skin and the bag. °¨ Leave the ice on for 15-20 minutes, 03-04 times a day. °· You may wear a rib belt as directed by your caregiver to reduce pain. °· Practice deep breathing as directed by your caregiver to keep your lungs clear. °· Only take over-the-counter or prescription medicines for pain, fever, or discomfort as directed by your caregiver. °SEEK IMMEDIATE MEDICAL  CARE IF:  °· You have increasing pain or shortness of breath. °· You cough up blood. °· You have nausea, vomiting, or abdominal pain. °· You have a fever. °· You feel dizzy, weak, or you faint. °MAKE SURE YOU: °· Understand these instructions. °· Will watch your condition. °· Will get help right away if you are not doing well or get worse. °Document Released: 12/22/2004 Document Revised: 02/06/2012 Document Reviewed: 08/31/2011 °[employer]® Patient Information ©2015 [employer]. This information is not intended to replace advice given to you by your health care provider. Make sure you discuss any questions you have with your health care provider. ° ° °
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
## 2015-07-01 ENCOUNTER — Encounter (HOSPITAL_BASED_OUTPATIENT_CLINIC_OR_DEPARTMENT_OTHER): Payer: Self-pay | Admitting: *Deleted

## 2015-07-01 ENCOUNTER — Emergency Department (HOSPITAL_BASED_OUTPATIENT_CLINIC_OR_DEPARTMENT_OTHER)
Admission: EM | Admit: 2015-07-01 | Discharge: 2015-07-01 | Disposition: A | Discharge status: Home / Self Care | Payer: Self-pay | Attending: Emergency Medicine | Admitting: Emergency Medicine

## 2015-07-01 DIAGNOSIS — L03114 Cellulitis of left upper limb: Secondary | ICD-10-CM | POA: Insufficient documentation

## 2015-07-01 DIAGNOSIS — Z87828 Personal history of other (healed) physical injury and trauma: Secondary | ICD-10-CM | POA: Insufficient documentation

## 2015-07-01 DIAGNOSIS — Z87891 Personal history of nicotine dependence: Secondary | ICD-10-CM | POA: Insufficient documentation

## 2015-07-01 DIAGNOSIS — Z8659 Personal history of other mental and behavioral disorders: Secondary | ICD-10-CM | POA: Insufficient documentation

## 2015-07-01 MED ORDER — CEPHALEXIN 500 MG PO CAPS: 500.0000 mg | ORAL_CAPSULE | Freq: Four times a day (QID) | ORAL | Status: DC

## 2015-07-01 NOTE — ED Notes (Signed)
Borders marked 

## 2015-07-01 NOTE — Discharge Instructions (Signed)
Take keflex as directed for 7 days. Apply warm compresses to your arm.  Cellulitis Cellulitis is an infection of the skin and the tissue beneath it. The infected area is usually red and tender. Cellulitis occurs most often in the arms and lower legs.  CAUSES  Cellulitis is caused by bacteria that enter the skin through cracks or cuts in the skin. The most common types of bacteria that cause cellulitis are staphylococci and streptococci. SIGNS AND SYMPTOMS   Redness and warmth.  Swelling.  Tenderness or pain.  Fever. DIAGNOSIS  Your health care provider can usually determine what is wrong based on a physical exam. Blood tests may also be done. TREATMENT  Treatment usually involves taking an antibiotic medicine. HOME CARE INSTRUCTIONS   Take your antibiotic medicine as directed by your health care provider. Finish the antibiotic even if you start to feel better.  Keep the infected arm or leg elevated to reduce swelling.  Apply a warm cloth to the affected area up to 4 times per day to relieve pain.  Take medicines only as directed by your health care provider.  Keep all follow-up visits as directed by your health care provider. SEEK MEDICAL CARE IF:   You notice red streaks coming from the infected area.  Your red area gets larger or turns dark in color.  Your bone or joint underneath the infected area becomes painful after the skin has healed.  Your infection returns in the same area or another area.  You notice a swollen bump in the infected area.  You develop new symptoms.  You have a fever. SEEK IMMEDIATE MEDICAL CARE IF:   You feel very sleepy.  You develop vomiting or diarrhea.  You have a general ill feeling (malaise) with muscle aches and pains. MAKE SURE YOU:   Understand these instructions.  Will watch your condition.  Will get help right away if you are not doing well or get worse. Document Released: 08/24/2005 Document Revised: 03/31/2014 Document  Reviewed: 01/30/2012 [facility] Patient Information 2015 [place], [place]. This information is not intended to replace advice given to you by your health care provider. Make sure you discuss any questions you have with your health care provider.
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
## 2015-07-01 NOTE — ED Notes (Signed)
Pt has a possible insect bite vs abscess on his upper left arm x2 days. Same has scabbed over area with red, warm area surrounding.

## 2016-06-05 ENCOUNTER — Emergency Department (HOSPITAL_BASED_OUTPATIENT_CLINIC_OR_DEPARTMENT_OTHER)
Admission: EM | Admit: 2016-06-05 | Discharge: 2016-06-05 | Disposition: A | Discharge status: Home / Self Care | Payer: Self-pay | Attending: Emergency Medicine | Admitting: Emergency Medicine

## 2016-06-05 ENCOUNTER — Encounter (HOSPITAL_BASED_OUTPATIENT_CLINIC_OR_DEPARTMENT_OTHER): Payer: Self-pay | Admitting: Emergency Medicine

## 2016-06-05 DIAGNOSIS — L03114 Cellulitis of left upper limb: Secondary | ICD-10-CM | POA: Insufficient documentation

## 2016-06-05 DIAGNOSIS — L0291 Cutaneous abscess, unspecified: Secondary | ICD-10-CM

## 2016-06-05 DIAGNOSIS — Z87891 Personal history of nicotine dependence: Secondary | ICD-10-CM | POA: Insufficient documentation

## 2016-06-05 DIAGNOSIS — F329 Major depressive disorder, single episode, unspecified: Secondary | ICD-10-CM | POA: Insufficient documentation

## 2016-06-05 DIAGNOSIS — L0390 Cellulitis, unspecified: Secondary | ICD-10-CM

## 2016-06-05 MED ORDER — OXYCODONE HCL 5 MG PO TABS
5.0000 mg | ORAL_TABLET | Freq: Once | ORAL | Status: AC
  Administered 2016-06-05: 5 mg via ORAL
  Filled 2016-06-05: qty 1

## 2016-06-05 MED ORDER — OXYCODONE-ACETAMINOPHEN 5-325 MG PO TABS
2.0000 | ORAL_TABLET | Freq: Once | ORAL | Status: AC
Start: 2016-06-05 — End: 2016-06-05
  Administered 2016-06-05: 2 via ORAL
  Filled 2016-06-05: qty 2

## 2016-06-05 MED ORDER — LIDOCAINE HCL (PF) 1 % IJ SOLN
30.0000 mL | Freq: Once | INTRAMUSCULAR | Status: AC
  Administered 2016-06-05: 30 mL
  Filled 2016-06-05: qty 30

## 2016-06-05 MED ORDER — OXYCODONE HCL 5 MG PO TABS: 5.0000 mg | ORAL_TABLET | Freq: Four times a day (QID) | ORAL | Status: DC | PRN

## 2016-06-05 MED ORDER — SULFAMETHOXAZOLE-TRIMETHOPRIM 800-160 MG PO TABS
1.0000 | ORAL_TABLET | Freq: Once | ORAL | Status: AC
  Administered 2016-06-05: 1 via ORAL
  Filled 2016-06-05: qty 1

## 2016-06-05 MED ORDER — CEPHALEXIN 500 MG PO CAPS: 500.0000 mg | ORAL_CAPSULE | Freq: Four times a day (QID) | ORAL | Status: DC

## 2016-06-05 MED ORDER — SULFAMETHOXAZOLE-TRIMETHOPRIM 800-160 MG PO TABS: 1.0000 | ORAL_TABLET | Freq: Two times a day (BID) | ORAL | Status: AC

## 2016-06-05 NOTE — ED Notes (Signed)
Pt in c/o abscess to L AC, has been tx with abx with no improvement. Ambulatory in NAD.
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
## 2016-06-05 NOTE — ED Notes (Signed)
MD at bedside. 

## 2018-10-27 ENCOUNTER — Emergency Department (HOSPITAL_COMMUNITY): Payer: Self-pay

## 2018-10-27 ENCOUNTER — Encounter (HOSPITAL_COMMUNITY): Payer: Self-pay | Admitting: *Deleted

## 2018-10-27 ENCOUNTER — Emergency Department (HOSPITAL_COMMUNITY)
Admission: EM | Admit: 2018-10-27 | Discharge: 2018-10-27 | Disposition: A | Discharge status: Home / Self Care | Payer: Self-pay | Attending: Emergency Medicine | Admitting: Emergency Medicine

## 2018-10-27 ENCOUNTER — Other Ambulatory Visit: Payer: Self-pay

## 2018-10-27 DIAGNOSIS — Z5329 Procedure and treatment not carried out because of patient's decision for other reasons: Secondary | ICD-10-CM | POA: Insufficient documentation

## 2018-10-27 DIAGNOSIS — R4020 Unspecified coma: Secondary | ICD-10-CM | POA: Insufficient documentation

## 2018-10-27 DIAGNOSIS — R4189 Other symptoms and signs involving cognitive functions and awareness: Secondary | ICD-10-CM

## 2018-10-27 LAB — COMPREHENSIVE METABOLIC PANEL
ALT: 18 U/L (ref 0–44)
ANION GAP: 7 (ref 5–15)
AST: 27 U/L (ref 15–41)
Albumin: 4.1 g/dL (ref 3.5–5.0)
Alkaline Phosphatase: 57 U/L (ref 38–126)
BUN: 15 mg/dL (ref 6–20)
CHLORIDE: 102 mmol/L (ref 98–111)
CO2: 27 mmol/L (ref 22–32)
Calcium: 9.1 mg/dL (ref 8.9–10.3)
Creatinine, Ser: 1.13 mg/dL (ref 0.61–1.24)
GFR calc Af Amer: >60 mL/min (ref >60)
GFR calc non Af Amer: >60 mL/min (ref >60)
Glucose, Bld: 97 mg/dL (ref 70–99)
POTASSIUM: 3.7 mmol/L (ref 3.5–5.1)
Sodium: 136 mmol/L (ref 135–145)
Total Bilirubin: 0.6 mg/dL (ref 0.3–1.2)
Total Protein: 7.4 g/dL (ref 6.5–8.1)

## 2018-10-27 LAB — CBC WITH DIFFERENTIAL/PLATELET
Abs Immature Granulocytes: 0.01 10*3/uL (ref 0.00–0.07)
Basophils Absolute: 0.1 10*3/uL (ref 0.0–0.1)
Basophils Relative: 1 %
EOS ABS: 0.1 10*3/uL (ref 0.0–0.5)
Eosinophils Relative: 2 %
HCT: 38.6 % — ABNORMAL LOW (ref 39.0–52.0)
Hemoglobin: 12 g/dL — ABNORMAL LOW (ref 13.0–17.0)
Immature Granulocytes: 0 %
Lymphocytes Relative: 40 %
Lymphs Abs: 2.5 10*3/uL (ref 0.7–4.0)
MCH: 28 pg (ref 26.0–34.0)
MCHC: 31.1 g/dL (ref 30.0–36.0)
MCV: 90 fL (ref 80.0–100.0)
MONOS PCT: 10 %
Monocytes Absolute: 0.6 10*3/uL (ref 0.1–1.0)
Neutro Abs: 2.9 10*3/uL (ref 1.7–7.7)
Neutrophils Relative %: 47 %
Platelets: 253 10*3/uL (ref 150–400)
RBC: 4.29 MIL/uL (ref 4.22–5.81)
RDW: 13.9 % (ref 11.5–15.5)
WBC: 6.1 10*3/uL (ref 4.0–10.5)
nRBC: 0 % (ref 0.0–0.2)

## 2018-10-27 LAB — ETHANOL

## 2018-10-27 LAB — CBG MONITORING, ED: Glucose-Capillary: 113 mg/dL — ABNORMAL HIGH (ref 70–99)

## 2018-10-27 NOTE — ED Notes (Signed)
Mrs. Jimmy Rogers from [facility]
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
## 2019-10-13 ENCOUNTER — Encounter (HOSPITAL_COMMUNITY): Payer: Self-pay

## 2019-10-13 ENCOUNTER — Emergency Department (HOSPITAL_COMMUNITY)
Admission: EM | Admit: 2019-10-13 | Discharge: 2019-10-13 | Disposition: A | Discharge status: Home / Self Care | Payer: Self-pay | Attending: Emergency Medicine | Admitting: Emergency Medicine

## 2019-10-13 DIAGNOSIS — K0889 Other specified disorders of teeth and supporting structures: Secondary | ICD-10-CM | POA: Insufficient documentation

## 2019-10-13 DIAGNOSIS — Z87891 Personal history of nicotine dependence: Secondary | ICD-10-CM | POA: Insufficient documentation

## 2019-10-13 MED ORDER — NAPROXEN 500 MG PO TABS: 500.0000 mg | ORAL_TABLET | Freq: Two times a day (BID) | ORAL | 0 refills | Status: DC

## 2019-10-13 MED ORDER — AMOXICILLIN 500 MG PO CAPS: 500.0000 mg | ORAL_CAPSULE | Freq: Three times a day (TID) | ORAL | 0 refills | Status: DC

## 2019-10-13 MED ORDER — HYDROCODONE-ACETAMINOPHEN 5-325 MG PO TABS
1.0000 | ORAL_TABLET | Freq: Once | ORAL | Status: AC
  Administered 2019-10-13: 19:00:00 1 via ORAL
  Filled 2019-10-13: qty 1

## 2019-10-13 MED ORDER — AMOXICILLIN 500 MG PO CAPS
500.0000 mg | ORAL_CAPSULE | Freq: Once | ORAL | Status: AC
Start: 2019-10-13 — End: 2019-10-13
  Administered 2019-10-13: 19:00:00 500 mg via ORAL
  Filled 2019-10-13: qty 1

## 2019-10-13 MED ORDER — ONDANSETRON 4 MG PO TBDP
4.0000 mg | ORAL_TABLET | Freq: Once | ORAL | Status: AC
  Administered 2019-10-13: 19:00:00 4 mg via ORAL
  Filled 2019-10-13: qty 1

## 2019-10-13 NOTE — Discharge Instructions (Signed)
Thank you for allowing me to care for you today. Please return to the emergency department if you have new or worsening symptoms. Take your medications as instructed.  ° °

## 2019-10-13 NOTE — ED Triage Notes (Signed)
patient complains of left lower dental pain for 2 days, pain radiating into jaw. Reports broken tooth
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
## 2020-05-23 ENCOUNTER — Encounter (HOSPITAL_BASED_OUTPATIENT_CLINIC_OR_DEPARTMENT_OTHER): Payer: Self-pay | Admitting: Emergency Medicine

## 2020-05-23 ENCOUNTER — Other Ambulatory Visit: Payer: Self-pay

## 2020-05-23 ENCOUNTER — Emergency Department (HOSPITAL_BASED_OUTPATIENT_CLINIC_OR_DEPARTMENT_OTHER)
Admission: EM | Admit: 2020-05-23 | Discharge: 2020-05-23 | Discharge status: Against Medical Advice | Payer: Self-pay | Attending: Emergency Medicine | Admitting: Emergency Medicine

## 2020-05-23 DIAGNOSIS — R0981 Nasal congestion: Secondary | ICD-10-CM | POA: Insufficient documentation

## 2020-05-23 DIAGNOSIS — M791 Myalgia, unspecified site: Secondary | ICD-10-CM | POA: Insufficient documentation

## 2020-05-23 DIAGNOSIS — F1910 Other psychoactive substance abuse, uncomplicated: Secondary | ICD-10-CM

## 2020-05-23 DIAGNOSIS — H538 Other visual disturbances: Secondary | ICD-10-CM | POA: Insufficient documentation

## 2020-05-23 DIAGNOSIS — R110 Nausea: Secondary | ICD-10-CM | POA: Insufficient documentation

## 2020-05-23 DIAGNOSIS — R509 Fever, unspecified: Secondary | ICD-10-CM | POA: Insufficient documentation

## 2020-05-23 DIAGNOSIS — M2550 Pain in unspecified joint: Secondary | ICD-10-CM | POA: Insufficient documentation

## 2020-05-23 DIAGNOSIS — R109 Unspecified abdominal pain: Secondary | ICD-10-CM | POA: Insufficient documentation

## 2020-05-23 DIAGNOSIS — Z87891 Personal history of nicotine dependence: Secondary | ICD-10-CM | POA: Insufficient documentation

## 2020-05-23 HISTORY — DX: Opioid abuse, uncomplicated: F11.10

## 2020-05-23 HISTORY — DX: Chronic pain syndrome: G89.4

## 2020-05-23 HISTORY — DX: Other psychoactive substance use, unspecified, uncomplicated: F19.90

## 2020-05-23 NOTE — Discharge Instructions (Addendum)
We have recommended work-up in the emergency department including Covid testing, chest x-ray, urine, blood cultures, labs, EKG.  I am concerned that you are having fever and have history of IV drug abuse and have a murmur on exam today.  You are at risk for bacteria in your bloodstream called bacteremia and endocarditis which can be life-threatening to you.  You have decided to leave AGAINST MEDICAL ADVICE without any of this work-up initiated at this time.  I recommend if you change your mind that you please return to the emergency department as soon as possible for further evaluation.

## 2020-05-23 NOTE — ED Triage Notes (Signed)
Pt c/o abd pain and fever x 2 days. Denies any n/v/d
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
## 2020-07-13 ENCOUNTER — Other Ambulatory Visit: Payer: Self-pay

## 2020-07-13 ENCOUNTER — Encounter (HOSPITAL_BASED_OUTPATIENT_CLINIC_OR_DEPARTMENT_OTHER): Payer: Self-pay | Admitting: *Deleted

## 2020-07-13 ENCOUNTER — Emergency Department (HOSPITAL_BASED_OUTPATIENT_CLINIC_OR_DEPARTMENT_OTHER)
Admission: EM | Admit: 2020-07-13 | Discharge: 2020-07-13 | Disposition: A | Discharge status: Home / Self Care | Payer: Self-pay | Attending: Emergency Medicine | Admitting: Emergency Medicine

## 2020-07-13 ENCOUNTER — Emergency Department (HOSPITAL_BASED_OUTPATIENT_CLINIC_OR_DEPARTMENT_OTHER): Payer: Self-pay

## 2020-07-13 DIAGNOSIS — S99922A Unspecified injury of left foot, initial encounter: Secondary | ICD-10-CM | POA: Insufficient documentation

## 2020-07-13 DIAGNOSIS — Z87891 Personal history of nicotine dependence: Secondary | ICD-10-CM | POA: Insufficient documentation

## 2020-07-13 DIAGNOSIS — Y999 Unspecified external cause status: Secondary | ICD-10-CM | POA: Insufficient documentation

## 2020-07-13 DIAGNOSIS — W228XXA Striking against or struck by other objects, initial encounter: Secondary | ICD-10-CM | POA: Insufficient documentation

## 2020-07-13 DIAGNOSIS — R55 Syncope and collapse: Secondary | ICD-10-CM | POA: Insufficient documentation

## 2020-07-13 DIAGNOSIS — Y9389 Activity, other specified: Secondary | ICD-10-CM | POA: Insufficient documentation

## 2020-07-13 DIAGNOSIS — Y9289 Other specified places as the place of occurrence of the external cause: Secondary | ICD-10-CM | POA: Insufficient documentation

## 2020-07-13 MED ORDER — IBUPROFEN 600 MG PO TABS: 600.0000 mg | ORAL_TABLET | Freq: Four times a day (QID) | ORAL | 0 refills | Status: DC | PRN

## 2020-07-13 MED ORDER — ACETAMINOPHEN 500 MG PO TABS: 500.0000 mg | ORAL_TABLET | Freq: Four times a day (QID) | ORAL | 0 refills | Status: DC | PRN

## 2020-07-13 NOTE — ED Notes (Signed)
Patient transported to X-ray 

## 2020-07-13 NOTE — Discharge Instructions (Signed)
1. Medications: Alternate 600 mg of ibuprofen and [phone] mg of Tylenol every 3 hours as needed for pain. Do not exceed 4000 mg of Tylenol daily.  Take ibuprofen with food to avoid upset stomach issues.  2. Treatment: rest, ice (can freeze a water bottle and roll it along the bottom of the foot), elevate and use CAM Walker and crutches to help you get around, drink plenty of fluids, gentle stretching 3. Follow Up: Please followup with a PCP in 1 week i for discussion of your diagnoses and further evaluation after today's visit; please call [facility] tomorrow to schedule an appointment.  Tell them you are referred from the emergency department.  They can reevaluate your foot pain and evaluate your possible seizures or determine the cause of your episodes; Please return to the ER for worsening symptoms or other concerns such as worsening swelling, redness of the skin, fevers, loss of pulses, or loss of feeling
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
## 2020-07-13 NOTE — ED Triage Notes (Signed)
Pt c/o left foot injury x 2 days ago

## 2020-07-13 NOTE — ED Notes (Signed)
ED Provider at bedside. 

## 2020-07-13 NOTE — ED Notes (Signed)
Pt verbalized understanding of discharge instructions and denies any further questions at this time.   

## 2020-07-29 ENCOUNTER — Ambulatory Visit: Payer: Self-pay | Admitting: Diagnostic Neuroimaging
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
## 2020-09-10 ENCOUNTER — Encounter: Payer: Self-pay | Admitting: Neurology

## 2020-09-10 ENCOUNTER — Ambulatory Visit: Payer: Self-pay | Admitting: Neurology

## 2020-09-10 ENCOUNTER — Telehealth: Payer: Self-pay | Admitting: *Deleted

## 2020-09-10 NOTE — Telephone Encounter (Signed)
Pt no showed new patient appt this morning with Dr Lucia Gaskins. Pt canceled 07/29/20 appointment with Dr Marjory Lies <24 hours prior.

## 2020-09-10 NOTE — Telephone Encounter (Signed)
Please dismiss thank you

## 2021-06-25 ENCOUNTER — Encounter (HOSPITAL_COMMUNITY): Payer: Self-pay

## 2021-06-25 ENCOUNTER — Emergency Department (HOSPITAL_COMMUNITY): Payer: Self-pay

## 2021-06-25 ENCOUNTER — Other Ambulatory Visit: Payer: Self-pay

## 2021-06-25 ENCOUNTER — Emergency Department (HOSPITAL_COMMUNITY)
Admission: EM | Admit: 2021-06-25 | Discharge: 2021-06-25 | Disposition: A | Discharge status: Home / Self Care | Payer: Self-pay | Attending: Emergency Medicine | Admitting: Emergency Medicine

## 2021-06-25 DIAGNOSIS — Z5321 Procedure and treatment not carried out due to patient leaving prior to being seen by health care provider: Secondary | ICD-10-CM | POA: Insufficient documentation

## 2021-06-25 DIAGNOSIS — R0602 Shortness of breath: Secondary | ICD-10-CM | POA: Insufficient documentation

## 2021-06-25 DIAGNOSIS — R0789 Other chest pain: Secondary | ICD-10-CM | POA: Insufficient documentation

## 2021-06-25 DIAGNOSIS — R059 Cough, unspecified: Secondary | ICD-10-CM | POA: Insufficient documentation

## 2021-06-25 NOTE — ED Notes (Signed)
Pt states he is leaving at this time because he has kids and he can't wait any longer.

## 2021-06-25 NOTE — ED Triage Notes (Signed)
Pt reports sob and chest congestion since last night, coughing but no fever, states he thinks he has strept throat because he is losing his voice but denies sore throat. Pt a.o, resp e.u

## 2022-02-26 ENCOUNTER — Encounter (HOSPITAL_BASED_OUTPATIENT_CLINIC_OR_DEPARTMENT_OTHER): Payer: Self-pay

## 2022-02-26 ENCOUNTER — Other Ambulatory Visit: Payer: Self-pay

## 2022-02-26 ENCOUNTER — Emergency Department (HOSPITAL_BASED_OUTPATIENT_CLINIC_OR_DEPARTMENT_OTHER)
Admission: EM | Admit: 2022-02-26 | Discharge: 2022-02-26 | Disposition: A | Discharge status: Home / Self Care | Payer: Self-pay | Attending: Emergency Medicine | Admitting: Emergency Medicine

## 2022-02-26 DIAGNOSIS — J029 Acute pharyngitis, unspecified: Secondary | ICD-10-CM | POA: Insufficient documentation

## 2022-02-26 DIAGNOSIS — K029 Dental caries, unspecified: Secondary | ICD-10-CM | POA: Insufficient documentation

## 2022-02-26 DIAGNOSIS — K0889 Other specified disorders of teeth and supporting structures: Secondary | ICD-10-CM

## 2022-02-26 MED ORDER — CLINDAMYCIN HCL 150 MG PO CAPS
300.0000 mg | ORAL_CAPSULE | Freq: Once | ORAL | Status: AC
  Administered 2022-02-26: 300 mg via ORAL
  Filled 2022-02-26: qty 2

## 2022-02-26 MED ORDER — CLINDAMYCIN HCL 300 MG PO CAPS: 300.0000 mg | ORAL_CAPSULE | Freq: Three times a day (TID) | ORAL | 0 refills | Status: AC

## 2022-02-26 MED ORDER — BUPIVACAINE HCL (PF) 0.5 % IJ SOLN
10.0000 mL | Freq: Once | INTRAMUSCULAR | Status: AC
  Administered 2022-02-26: 10 mL
  Filled 2022-02-26: qty 10

## 2022-02-26 MED ORDER — NAPROXEN 375 MG PO TABS: 375.0000 mg | ORAL_TABLET | Freq: Two times a day (BID) | ORAL | 0 refills | Status: DC
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
## 2022-02-26 NOTE — Discharge Instructions (Addendum)
In the emergency room you received your first dose of antibiotic and a dental block.  Of also given you prescription for the antibiotics and naproxen which you requested to be printed.  Along with naproxen please not take ibuprofen as they are both anti-inflammatory.  I have also attached our on-call dentist.  Please give them a call to schedule a follow-up appointment.  You do still need to see a dentist to have this addressed.  I have also attached a resource dental guide with additional dental clinics out in the community.  If you have any worsening symptoms, difficulty swallowing, other concerning symptoms please return to the emergency room. ?

## 2022-02-26 NOTE — ED Triage Notes (Addendum)
Pt has swelling pain left lower tooth,  taking ibuprofen with little relief.  Denies fever.  No dental care, using mouth rinses and improved oral hygiene without improvement. Last took Ibuprofen at 4pm ?

## 2022-07-05 ENCOUNTER — Other Ambulatory Visit: Payer: Self-pay

## 2022-07-05 ENCOUNTER — Emergency Department (HOSPITAL_BASED_OUTPATIENT_CLINIC_OR_DEPARTMENT_OTHER)
Admission: EM | Admit: 2022-07-05 | Discharge: 2022-07-05 | Disposition: A | Discharge status: Home / Self Care | Payer: Self-pay | Attending: Emergency Medicine | Admitting: Emergency Medicine

## 2022-07-05 ENCOUNTER — Encounter (HOSPITAL_BASED_OUTPATIENT_CLINIC_OR_DEPARTMENT_OTHER): Payer: Self-pay | Admitting: Emergency Medicine

## 2022-07-05 DIAGNOSIS — K029 Dental caries, unspecified: Secondary | ICD-10-CM | POA: Insufficient documentation

## 2022-07-05 MED ORDER — AMOXICILLIN-POT CLAVULANATE 875-125 MG PO TABS: 1.0000 | ORAL_TABLET | Freq: Two times a day (BID) | ORAL | 0 refills | Status: DC

## 2022-07-05 NOTE — Discharge Instructions (Signed)
Please use Tylenol or ibuprofen for pain.  You may use 600 mg ibuprofen every 6 hours or 1000 mg of Tylenol every 6 hours.  You may choose to alternate between the 2.  This would be most effective.  Not to exceed 4 g of Tylenol within 24 hours.  Not to exceed 3200 mg ibuprofen 24 hours.  Can use Orajel, ice to the side of the face in addition to the above to help with your tooth pain.  Please take the entire course of antibiotics that prescribed.  Please go through the dental resource guide that I provided to help to establish care with a dentist as soon as you are able so they can help to remove the tooth.

## 2022-07-05 NOTE — ED Provider Notes (Signed)
[facility] EMERGENCY DEPARTMENT Provider Note   CSN: [id number] Arrival date & time: 07/05/22  1623     History  Chief Complaint  Patient presents with   Dental Pain    Left lower    Destan TAYTON DECAIRE is a 41 y.o. male with history of known dental pain, broken teeth who presents with concern for left lower toothache for the last 2 days.  Patient reports that he is working to establish care with a dentist, just needs some antibiotics and a note for work for the day.  Patient reports pain is 9/10.  He has not taken anything for pain prior to arrival.  Patient denies fever, chills but does endorse some left-sided face pain radiating into the jaw and neck.  Denies foul taste in mouth, purulent drainage.   Dental Pain      Home Medications Prior to Admission medications   Medication Sig Start Date End Date Taking? Authorizing Provider  amoxicillin-clavulanate (AUGMENTIN) 875-125 MG tablet Take 1 tablet by mouth every 12 (twelve) hours. 07/05/22  Yes Monique Gift H, PA-C  acetaminophen (TYLENOL) 500 MG tablet Take 1 tablet (500 mg total) by mouth every 6 (six) hours as needed. 07/13/20   Fawze, Mina A, PA-C  ibuprofen (ADVIL) 600 MG tablet Take 1 tablet (600 mg total) by mouth every 6 (six) hours as needed. 07/13/20   Fawze, Mina A, PA-C  naproxen (NAPROSYN) 375 MG tablet Take 1 tablet (375 mg total) by mouth 2 (two) times daily. 02/26/22   Marita Kansas, PA-C      Allergies    Tylenol [acetaminophen] and Vicodin [hydrocodone-acetaminophen]    Review of Systems   Review of Systems  HENT:  Positive for dental problem.   All other systems reviewed and are negative.   Physical Exam Updated Vital Signs BP 121/87 (BP Location: Left Arm)   Pulse 65   Temp 97.8 F (36.6 C)   Resp 18   Ht 5\' 11"  (1.803 m)   Wt 90.7 kg   SpO2 96%   BMI 27.89 kg/m  Physical Exam Vitals and nursing note reviewed.  Constitutional:      General: He is not in acute distress.    Appearance:  Normal appearance.  HENT:     Head: Normocephalic and atraumatic.     Mouth/Throat:      Comments: Multiple abscessed, broken teeth with localized gum swelling.  No obvious abscess.  Tonsils 1+, uvula midline, no floor of mouth redness, swelling. Eyes:     General:        Right eye: No discharge.        Left eye: No discharge.  Cardiovascular:     Rate and Rhythm: Normal rate and regular rhythm.  Pulmonary:     Effort: Pulmonary effort is normal. No respiratory distress.  Musculoskeletal:        General: No deformity.  Skin:    General: Skin is warm and dry.  Neurological:     Mental Status: He is alert and oriented to person, place, and time.  Psychiatric:        Mood and Affect: Mood normal.        Behavior: Behavior normal.     ED Results / Procedures / Treatments   Labs (all labs ordered are listed, but only abnormal results are displayed) Labs Reviewed - No data to display  EKG None  Radiology No results found.  Procedures Procedures    Medications Ordered in ED Medications -  No data to display  ED Course/ Medical Decision Making/ A&P                           Medical Decision Making  This an overall well-appearing 41 year old male who presents with concern for dental pain.  Physical exam reveals cavities, broken teeth and left lower mouth.  He has some redness, gum swelling without evidence of abscess, periapical abscess, PTA, uvula deviation, pharyngitis, epiglottitis, dysphonia, stridor.  Patient without difficulty swallowing.  No systemic fever, chills.  Given patient's symptoms think it is reasonable to treat with antibiotics.  Encouraged ibuprofen, Tylenol, Orajel, ice for pain control.  Encouraged urgent dental follow-up.  Provided Augmentin for antibiotic coverage.  Patient discharged in stable condition at this time, return precautions given. Final Clinical Impression(s) / ED Diagnoses Final diagnoses:  Pain due to dental caries    Rx / DC  Orders ED Discharge Orders          Ordered    amoxicillin-clavulanate (AUGMENTIN) 875-125 MG tablet  Every 12 hours        07/05/22 1709              Mala Gibbard, Ramah H, PA-C 07/05/22 1717    Vanetta Mulders, MD 07/08/22 1112

## 2022-07-05 NOTE — ED Triage Notes (Signed)
Left lower toothache x 2 days . Left work , reports needs antibiotics and note for work .

## 2024-03-04 ENCOUNTER — Telehealth: Payer: Self-pay

## 2024-03-04 ENCOUNTER — Other Ambulatory Visit (HOSPITAL_COMMUNITY): Payer: Self-pay

## 2024-03-04 NOTE — Telephone Encounter (Signed)
 [facility] Patient Advocate Encounter  Insurance verification completed.    The patient is uninsured and will need patient assistance for medication.  We can complete the application and will need to meet with the patient for signatures and income documentation.

## 2024-03-11 ENCOUNTER — Other Ambulatory Visit (HOSPITAL_COMMUNITY): Payer: Self-pay

## 2024-03-13 ENCOUNTER — Encounter: Payer: Self-pay | Admitting: Family

## 2024-03-28 ENCOUNTER — Encounter: Payer: Self-pay | Admitting: Infectious Diseases

## 2024-03-29 ENCOUNTER — Encounter: Payer: Self-pay | Admitting: Infectious Diseases

## 2024-04-17 ENCOUNTER — Encounter: Payer: Self-pay | Admitting: Internal Medicine

## 2024-10-10 ENCOUNTER — Other Ambulatory Visit: Payer: Self-pay

## 2024-10-10 ENCOUNTER — Emergency Department (HOSPITAL_BASED_OUTPATIENT_CLINIC_OR_DEPARTMENT_OTHER)

## 2024-10-10 ENCOUNTER — Emergency Department (HOSPITAL_BASED_OUTPATIENT_CLINIC_OR_DEPARTMENT_OTHER)
Admission: EM | Admit: 2024-10-10 | Discharge: 2024-10-10 | Disposition: A | Discharge status: Home / Self Care | Attending: Emergency Medicine | Admitting: Emergency Medicine

## 2024-10-10 ENCOUNTER — Encounter (HOSPITAL_BASED_OUTPATIENT_CLINIC_OR_DEPARTMENT_OTHER): Payer: Self-pay | Admitting: Emergency Medicine

## 2024-10-10 DIAGNOSIS — S4492XA Injury of unspecified nerve at shoulder and upper arm level, left arm, initial encounter: Secondary | ICD-10-CM | POA: Diagnosis present

## 2024-10-10 DIAGNOSIS — W1839XA Other fall on same level, initial encounter: Secondary | ICD-10-CM | POA: Diagnosis not present

## 2024-10-10 NOTE — Discharge Instructions (Signed)
 Please use Tylenol  or ibuprofen  for pain.  You may use 600 mg ibuprofen  every 6 hours or 1000 mg of Tylenol  every 6 hours.  You may choose to alternate between the 2.  This would be most effective.  Not to exceed 4 g of Tylenol  within 24 hours.  Not to exceed 3200 mg ibuprofen  24 hours.  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  If you have sudden worsening weakness, numbness, I would return for further evaluation.  You can wear the sling for comfort, but I would take the arm out and move it as tolerated that does not become overly stiff and to encourage healing.

## 2024-10-10 NOTE — ED Triage Notes (Signed)
 Fell asleep on left arm about 915 and now it is numb he states  can move shoulder and wrist but it is numb can wiggle fingers , feel me touch has < sec cap refill

## 2024-10-10 NOTE — ED Provider Notes (Signed)
 [place] [facility] AT [facility] Provider Note   CSN: [id number] Arrival date & time: 10/10/24  1432     Patient presents with: Numbness   Jimmy Rogers is a 43 y.o. male with past medical history seen for cocaine abuse, heroin abuse, chronic pain syndrome, IV drug use who presents with concern for arm numbness, weakness.  He reports he fell fell asleep on his left arm around 915, he endorses some numb sensation, he does have some retention of sensation but it feels heavy, weak.  He can move at the wrist, elbow, but cannot lift his arm at the level of the shoulder greater than 10 degrees of flexion or extension.   HPI     Prior to Admission medications   Medication Sig Start Date End Date Taking? Authorizing Provider  acetaminophen  (TYLENOL ) 500 MG tablet Take 1 tablet (500 mg total) by mouth every 6 (six) hours as needed. 07/13/20   Fawze, Mina A, PA-C  amoxicillin -clavulanate (AUGMENTIN ) 875-125 MG tablet Take 1 tablet by mouth every 12 (twelve) hours. 07/05/22   Luciano Cinquemani H, PA-C  ibuprofen  (ADVIL ) 600 MG tablet Take 1 tablet (600 mg total) by mouth every 6 (six) hours as needed. 07/13/20   Fawze, Mina A, PA-C  naproxen  (NAPROSYN ) 375 MG tablet Take 1 tablet (375 mg total) by mouth 2 (two) times daily. 02/26/22   Hildegard Loge, PA-C    Allergies: Tylenol  [acetaminophen ] and Vicodin [hydrocodone -acetaminophen ]    Review of Systems  All other systems reviewed and are negative.   Updated Vital Signs BP 109/75 (BP Location: Left Arm)   Pulse (!) 48   Temp (!) 97.4 F (36.3 C) (Oral)   Resp 16   Ht 5' 11 (1.803 m)   Wt 74.8 kg   SpO2 97%   BMI 23.01 kg/m   Physical Exam Vitals and nursing note reviewed.  Constitutional:      General: He is not in acute distress.    Appearance: Normal appearance.  HENT:     Head: Normocephalic and atraumatic.  Eyes:     General:        Right eye: No discharge.        Left eye: No discharge.   Cardiovascular:     Rate and Rhythm: Normal rate and regular rhythm.     Pulses: Normal pulses.     Heart sounds: No murmur heard.    No friction rub. No gallop.     Comments: Radial, ulnar pulses are 2+ in the affected left upper extremity. Pulmonary:     Effort: Pulmonary effort is normal.     Breath sounds: Normal breath sounds.  Abdominal:     General: Bowel sounds are normal.     Palpations: Abdomen is soft.  Musculoskeletal:     Comments: Focal ttp over anterior humeral head on left -- decreased strength to flexion, extension abduction at the left shoulder past around 10-20 degrees of range of motion. Normal grip strength at hand, normal adductive strength. Slight decrease in strength to flexion, extension at level of elbow. No stepoff or deformity. Tenderness does have some mild radiation towards left cervical paraspinous muscles  Skin:    General: Skin is warm and dry.     Capillary Refill: Capillary refill takes less than 2 seconds. Distal capillary refill less than 2 seconds of the left upper extremity Neurological:     Mental Status: He is alert and oriented to person, place, and time.  Psychiatric:  Mood and Affect: Mood normal.        Behavior: Behavior normal.     (all labs ordered are listed, but only abnormal results are displayed) Labs Reviewed - No data to display  EKG: None  Radiology: DG Shoulder Left Result Date: 10/10/2024 CLINICAL DATA:  Left shoulder numbness. EXAM: LEFT SHOULDER - 2+ VIEW COMPARISON:  None Available. FINDINGS: There is no evidence of fracture or dislocation. There is no evidence of arthropathy or other focal bone abnormality. Soft tissues are unremarkable. IMPRESSION: Negative. Electronically Signed   By: Vanetta Chou M.D.   On: 10/10/2024 15:40     Procedures   Medications Ordered in the ED - No data to display                                  Medical Decision Making  This patient is a 43 y.o. male who presents to  the ED for concern of numbness, weakness of shoulder after sleeping on the affected extremity.   Differential diagnoses prior to evaluation: Cervical radiculopathy, thoracic outlet syndrome, neuropraxia, given history of IV drug use although remote did consider epidural abscess, osteomyelitis, or other spinal cord infection, nerve palsy  Past Medical History / Social History / Additional history: Chart reviewed. Pertinent results include: cocaine abuse, heroin abuse, chronic pain syndrome, IV drug use  Physical Exam: Physical exam performed. The pertinent findings include: Focal ttp over anterior humeral head on left -- decreased strength to flexion, extension abduction at the left shoulder past around 10-20 degrees of range of motion. Normal grip strength at hand, normal adductive strength. Slight decrease in strength to flexion, extension at level of elbow. No stepoff or deformity. Tenderness does have some mild radiation towards left cervical paraspinous muscles   Distal capillary refill less than 2 seconds of the left upper extremity  Radial, ulnar pulses are 2+ in the affected left upper extremity.  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   emergency department workup does not suggest an emergent condition requiring admission or immediate intervention beyond what has been performed at this time. The plan is: as above. The patient is safe for discharge and has been instructed to return immediately for worsening symptoms, change in  symptoms or any other concerns.   Final diagnoses:  Neurapraxia of left upper extremity, initial encounter    ED Discharge Orders     None          Rosan Sherlean DEL, PA-C 10/10/24 1603    Lenor Hollering, MD 10/10/24 [phone]

## 2024-10-30 ENCOUNTER — Other Ambulatory Visit (HOSPITAL_COMMUNITY): Payer: Self-pay

## 2024-10-30 ENCOUNTER — Telehealth: Payer: Self-pay

## 2024-10-30 NOTE — Telephone Encounter (Signed)
 [facility] Patient Advocate Encounter  Insurance verification completed.    The patient is insured through [employer].     Ran test claim for [id number]  The current 30 day co-pay is $4.  Ran test claim for [employer]  The current 30 day co-pay is $4.   We will continue to follow to see if copay assistance is needed.  This test claim was processed through [facility]- copay amounts may vary at other pharmacies due to pharmacy/plan contracts, or as the patient moves through the different stages of their insurance plan.

## 2024-11-04 ENCOUNTER — Other Ambulatory Visit: Payer: Self-pay

## 2024-11-04 ENCOUNTER — Encounter: Payer: Self-pay | Admitting: Internal Medicine

## 2024-11-04 ENCOUNTER — Ambulatory Visit: Admitting: Internal Medicine

## 2024-11-04 VITALS — BP 138/85 | HR 44 | Temp 97.6°F | Ht 71.0 in | Wt 168.0 lb

## 2024-11-04 DIAGNOSIS — B182 Chronic viral hepatitis C: Secondary | ICD-10-CM

## 2024-11-04 NOTE — Patient Instructions (Signed)
 we will call once US  results and labs results available with treatment plan F/U with pharmacy to start medication, myself at the end of treatment
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
## 2024-11-06 ENCOUNTER — Other Ambulatory Visit

## 2024-11-07 ENCOUNTER — Other Ambulatory Visit: Payer: Self-pay

## 2024-11-07 ENCOUNTER — Other Ambulatory Visit

## 2024-11-07 DIAGNOSIS — B182 Chronic viral hepatitis C: Secondary | ICD-10-CM

## 2024-11-14 ENCOUNTER — Ambulatory Visit (HOSPITAL_COMMUNITY)

## 2024-11-16 LAB — CBC
HCT: 35.7 % — ABNORMAL LOW (ref 39.4–51.1)
Hemoglobin: 11.3 g/dL — ABNORMAL LOW (ref 13.2–17.1)
MCH: 28.8 pg (ref 27.0–33.0)
MCHC: 31.7 g/dL (ref 31.6–35.4)
MCV: 90.8 fL (ref 81.4–101.7)
MPV: 11.9 fL (ref 7.5–12.5)
Platelets: 237 Thousand/uL (ref 140–400)
RBC: 3.93 Million/uL — ABNORMAL LOW (ref 4.20–5.80)
RDW: 13.7 % (ref 11.0–15.0)
WBC: 8.7 Thousand/uL (ref 3.8–10.8)

## 2024-11-16 LAB — COMPLETE METABOLIC PANEL WITHOUT GFR
AG Ratio: 1.7 (calc) (ref 1.0–2.5)
ALT: 16 U/L (ref 9–46)
AST: 19 U/L (ref 10–40)
Albumin: 4.1 g/dL (ref 3.6–5.1)
Alkaline phosphatase (APISO): 68 U/L (ref 36–130)
BUN: 16 mg/dL (ref 7–25)
CO2: 27 mmol/L (ref 20–32)
Calcium: 9.7 mg/dL (ref 8.6–10.3)
Chloride: 101 mmol/L (ref 98–110)
Creat: 1.15 mg/dL (ref 0.60–1.29)
Globulin: 2.4 g/dL (ref 1.9–3.7)
Glucose, Bld: 121 mg/dL — ABNORMAL HIGH (ref 65–99)
Potassium: 4.5 mmol/L (ref 3.5–5.3)
Sodium: 140 mmol/L (ref 135–146)
Total Bilirubin: 0.5 mg/dL (ref 0.2–1.2)
Total Protein: 6.5 g/dL (ref 6.1–8.1)

## 2024-11-16 LAB — LIVER FIBROSIS, FIBROTEST-ACTITEST
ALT: 16 U/L (ref 9–46)
Alpha-2-Macroglobulin: 211 mg/dL (ref 106–279)
Apolipoprotein A1: 146 mg/dL (ref 94–176)
Bilirubin: 0.5 mg/dL (ref 0.2–1.2)
Fibrosis Score: 0.12
GGT: 16 U/L (ref 3–95)
Haptoglobin: 237 mg/dL — ABNORMAL HIGH (ref 43–212)
Necroinflammat ACT Score: 0.04
Reference ID: 5850459

## 2024-11-16 LAB — HIV-1 AND HIV-2 RNA, QUALITATIVE REAL-TIME PCR
HIV 1 RNA, QL RT PCR: NOT DETECTED
HIV 2 RNA, QL RT PCR: NOT DETECTED

## 2024-11-16 LAB — HIV ANTIBODY (ROUTINE TESTING W REFLEX)
HIV 1&2 Ab, 4th Generation: REACTIVE
HIV FINAL INTERPRETATION: NEGATIVE

## 2024-11-16 LAB — SYPHILIS: RPR W/REFLEX TO RPR TITER AND TREPONEMAL ANTIBODIES, TRADITIONAL SCREENING AND DIAGNOSIS ALGORITHM: RPR Ser Ql: NONREACTIVE

## 2024-11-16 LAB — HEPATITIS C ANTIBODY: Hepatitis C Ab: REACTIVE — AB

## 2024-11-16 LAB — HEPATITIS B CORE ANTIBODY, TOTAL: Hep B Core Total Ab: NONREACTIVE

## 2024-11-16 LAB — HEPATITIS C RNA QUANTITATIVE
HCV Quantitative Log: 6.72 {Log_IU}/mL — ABNORMAL HIGH
HCV RNA, PCR, QN: 5190000 [IU]/mL — ABNORMAL HIGH

## 2024-11-16 LAB — PROTIME-INR
INR: 1
Prothrombin Time: 10.2 s (ref 9.0–11.5)

## 2024-11-16 LAB — HEPATITIS B SURFACE ANTIGEN: Hepatitis B Surface Ag: NONREACTIVE

## 2024-11-16 LAB — HEPATITIS B SURFACE ANTIBODY,QUALITATIVE: Hep B S Ab: REACTIVE — AB

## 2024-11-16 LAB — HEPATITIS C GENOTYPE

## 2024-11-16 LAB — HIV-1/2 AB - DIFFERENTIATION
HIV-1 antibody: NEGATIVE
HIV-2 Ab: NEGATIVE

## 2024-11-25 ENCOUNTER — Ambulatory Visit (HOSPITAL_COMMUNITY)
Admission: RE | Admit: 2024-11-25 | Discharge: 2024-11-25 | Disposition: A | Discharge status: Home / Self Care | Source: Ambulatory Visit | Attending: Internal Medicine | Admitting: Internal Medicine

## 2024-11-25 DIAGNOSIS — B182 Chronic viral hepatitis C: Secondary | ICD-10-CM | POA: Insufficient documentation

## 2024-11-26 ENCOUNTER — Telehealth: Payer: Self-pay

## 2024-11-26 NOTE — Telephone Encounter (Signed)
 Patient daughter called to follow up on next steps after US . Waiting on report from radiologist. He understands that once all results are back staff will work on maintaining medication.  Lorenda CHRISTELLA Code, RMA

## 2024-12-02 ENCOUNTER — Ambulatory Visit: Payer: Self-pay | Admitting: Internal Medicine

## 2024-12-02 NOTE — Progress Notes (Signed)
 Mavyret x 8 weeks please

## 2024-12-03 ENCOUNTER — Other Ambulatory Visit (HOSPITAL_COMMUNITY): Payer: Self-pay

## 2024-12-09 ENCOUNTER — Other Ambulatory Visit: Payer: Self-pay | Admitting: Pharmacist

## 2024-12-09 DIAGNOSIS — B182 Chronic viral hepatitis C: Secondary | ICD-10-CM

## 2024-12-09 MED ORDER — MAVYRET 100-40 MG PO TABS
3.0000 | ORAL_TABLET | Freq: Every day | ORAL | 1 refills | Status: AC
  Filled 2024-12-17: qty 84, 28d supply, fill #0

## 2024-12-09 NOTE — Progress Notes (Signed)
 Pharmacy: please call pt for medication instructions/start

## 2024-12-10 ENCOUNTER — Telehealth: Payer: Self-pay

## 2024-12-10 NOTE — Progress Notes (Addendum)
 1/13 Called patient's phone number, someone else picked up. I asked if I could speak with [facility]; they said he was feeling very sick and couldn't talk on the phone right now. I said that was okay and he could call us  back when he was up to it to talk about the new medication (Mavyret ) he is starting.   1/21 Called again to talk to [facility] about his new Mavyret  prescription. No answer; left voicemail.   1/27 Called again to talk to [facility] about his new Mavyret  prescription. No answer; voicemail inbox is now full and could not leave a message.   1/30 Called again to talk to [facility] about his new Mavyret  prescription. Called to both the home and mobile numbers listed in chart. No answer on either; voicemail inbox was full on both numbers so I could not leave a message.   Maurilio Patten, PharmD PGY1 Pharmacy Resident [facility] 12/27/2024 9:49 AM

## 2024-12-17 ENCOUNTER — Other Ambulatory Visit (HOSPITAL_COMMUNITY): Payer: Self-pay

## 2024-12-17 ENCOUNTER — Other Ambulatory Visit: Payer: Self-pay

## 2024-12-17 NOTE — Progress Notes (Signed)
 Specialty Pharmacy Initial Fill Coordination Note  Jimmy Rogers is a 44 y.o. male contacted today regarding initial fill of specialty medication(s) Glecaprevir -Pibrentasvir  (Mavyret )   Patient requested Delivery   Delivery date: 12/19/24   Verified address: [address] [place] [address]   Medication will be filled on 12/18/24.   Patient is aware of $4.00 copayment.

## 2024-12-18 ENCOUNTER — Other Ambulatory Visit: Payer: Self-pay

## 2024-12-24 ENCOUNTER — Other Ambulatory Visit (HOSPITAL_COMMUNITY): Payer: Self-pay

## 2025-01-03 ENCOUNTER — Telehealth: Payer: Self-pay | Admitting: Pharmacist

## 2025-01-03 NOTE — Telephone Encounter (Signed)
 We have tried at least 5 times to reach patient on both available numbers to counsel patient on Mavyret . Both numbers have full VM boxes. Patient was able to receive Mayret through [facility] and will see me later this month for one-month follow-up.  Alan Geralds, PharmD, CPP, BCIDP, AAHIVP Clinical Pharmacist Practitioner Infectious Diseases Clinical Pharmacist [facility]

## 2025-01-22 ENCOUNTER — Ambulatory Visit: Payer: Self-pay | Admitting: Pharmacist
